# Patient Record
Sex: Female | Born: 1935 | Race: White | Hispanic: No | Marital: Single | Smoking: Former smoker
Health system: Southern US, Community
[De-identification: ages and names within clinical notes are randomized; demographics above are authoritative.]

## PROBLEM LIST (undated history)

## (undated) DIAGNOSIS — Z8551 Personal history of malignant neoplasm of bladder: Secondary | ICD-10-CM

## (undated) DIAGNOSIS — F0280 Dementia in other diseases classified elsewhere without behavioral disturbance: Secondary | ICD-10-CM

## (undated) DIAGNOSIS — I10 Essential (primary) hypertension: Secondary | ICD-10-CM

## (undated) DIAGNOSIS — E079 Disorder of thyroid, unspecified: Secondary | ICD-10-CM

## (undated) DIAGNOSIS — G309 Alzheimer's disease, unspecified: Secondary | ICD-10-CM

## (undated) DIAGNOSIS — E7800 Pure hypercholesterolemia, unspecified: Secondary | ICD-10-CM

## (undated) HISTORY — PX: ABDOMINAL HYSTERECTOMY: SHX81

---

## 2017-08-01 ENCOUNTER — Other Ambulatory Visit: Payer: Self-pay

## 2017-08-01 ENCOUNTER — Emergency Department (HOSPITAL_COMMUNITY): Payer: Medicare HMO

## 2017-08-01 ENCOUNTER — Encounter (HOSPITAL_COMMUNITY): Payer: Self-pay

## 2017-08-01 ENCOUNTER — Emergency Department (HOSPITAL_COMMUNITY)
Admission: EM | Admit: 2017-08-01 | Discharge: 2017-08-02 | Disposition: A | Discharge status: Home / Self Care | Payer: Medicare HMO | Attending: Emergency Medicine | Admitting: Emergency Medicine

## 2017-08-01 DIAGNOSIS — Y999 Unspecified external cause status: Secondary | ICD-10-CM | POA: Insufficient documentation

## 2017-08-01 DIAGNOSIS — I10 Essential (primary) hypertension: Secondary | ICD-10-CM | POA: Diagnosis not present

## 2017-08-01 DIAGNOSIS — Y92129 Unspecified place in nursing home as the place of occurrence of the external cause: Secondary | ICD-10-CM | POA: Insufficient documentation

## 2017-08-01 DIAGNOSIS — S01111A Laceration without foreign body of right eyelid and periocular area, initial encounter: Secondary | ICD-10-CM | POA: Diagnosis not present

## 2017-08-01 DIAGNOSIS — S0990XA Unspecified injury of head, initial encounter: Secondary | ICD-10-CM | POA: Insufficient documentation

## 2017-08-01 DIAGNOSIS — Y939 Activity, unspecified: Secondary | ICD-10-CM | POA: Insufficient documentation

## 2017-08-01 DIAGNOSIS — Z79899 Other long term (current) drug therapy: Secondary | ICD-10-CM | POA: Diagnosis not present

## 2017-08-01 DIAGNOSIS — Z23 Encounter for immunization: Secondary | ICD-10-CM | POA: Insufficient documentation

## 2017-08-01 DIAGNOSIS — W1830XA Fall on same level, unspecified, initial encounter: Secondary | ICD-10-CM | POA: Diagnosis not present

## 2017-08-01 DIAGNOSIS — S0181XA Laceration without foreign body of other part of head, initial encounter: Secondary | ICD-10-CM

## 2017-08-01 HISTORY — DX: Essential (primary) hypertension: I10

## 2017-08-01 HISTORY — DX: Pure hypercholesterolemia, unspecified: E78.00

## 2017-08-01 HISTORY — DX: Disorder of thyroid, unspecified: E07.9

## 2017-08-01 MED ORDER — TETANUS-DIPHTH-ACELL PERTUSSIS 5-2.5-18.5 LF-MCG/0.5 IM SUSP
0.5000 mL | Freq: Once | INTRAMUSCULAR | Status: AC
  Administered 2017-08-01: 0.5 mL via INTRAMUSCULAR
  Filled 2017-08-01: qty 0.5

## 2017-08-01 MED ORDER — LIDOCAINE-EPINEPHRINE-TETRACAINE (LET) SOLUTION
3.0000 mL | Freq: Once | NASAL | Status: AC
  Administered 2017-08-01: 3 mL via TOPICAL
  Filled 2017-08-01: qty 3

## 2017-08-01 MED ORDER — LIDOCAINE HCL (PF) 1 % IJ SOLN
5.0000 mL | Freq: Once | INTRAMUSCULAR | Status: AC
  Administered 2017-08-01: 5 mL via INTRADERMAL
  Filled 2017-08-01: qty 30

## 2017-08-01 NOTE — ED Provider Notes (Signed)
[facility] DEPT Provider Note   CSN: [id number] Arrival date & time: 08/01/17  2249     History   Chief Complaint Chief Complaint  Patient presents with  . Fall  . Head Injury  . Facial Laceration    HPI   Blood pressure 125/79, pulse 65, temperature 97.6 F (36.4 C), temperature source Oral, resp. rate 16, height 5\' 4"  (1.626 m), weight 63 kg (139 lb), SpO2 97 %.  Lynn Cameron is a 84 y.o. female brought in by EMS for unwitnessed fall at [facility].  She fell on a carpet.  No blood thinners.  Patient denies any neck pain, chest pain, abdominal pain.  She states that she had a mechanical fall and did not lose consciousness.  Has been ambulatory since the event.   Past Medical History:  Diagnosis Date  . High cholesterol   . Hypertension   . Thyroid disease     There are no active problems to display for this patient.   History reviewed. No pertinent surgical history.  OB History    No data available       Home Medications    Prior to Admission medications   Medication Sig Start Date End Date Taking? Authorizing Provider  ALPRAZolam Duanne Moron) 0.25 MG tablet Take 0.25 mg by mouth daily as needed for anxiety.   Yes [provider]  cycloSPORINE (RESTASIS) 0.05 % ophthalmic emulsion Place 1 drop into both eyes 2 (two) times daily.   Yes [provider]  donepezil (ARICEPT) 5 MG tablet Take 5 mg by mouth at bedtime.   Yes [provider]  levothyroxine (SYNTHROID, LEVOTHROID) 50 MCG tablet Take 50 mcg by mouth daily before breakfast.   Yes [provider]  Mineral Oil Heavy OIL Place 2 drops into both ears once a week.   Yes [provider]  pravastatin (PRAVACHOL) 40 MG tablet Take 40 mg by mouth daily.   Yes [provider]  sertraline (ZOLOFT) 100 MG tablet Take 100 mg by mouth daily.   Yes [provider]  traZODone (DESYREL) 50 MG tablet Take 50 mg by mouth at  bedtime.   Yes [provider]  triamterene-hydrochlorothiazide (MAXZIDE-25) 37.5-25 MG tablet Take 1 tablet by mouth every other day.   Yes [provider]    Family History History reviewed. No pertinent family history.  Social History Social History   Tobacco Use  . Smoking status: Never Smoker  . Smokeless tobacco: Never Used  Substance Use Topics  . Alcohol use: No    Frequency: Never  . Drug use: No     Allergies   Patient has no known allergies.   Review of Systems Review of Systems  A complete review of systems was obtained and all systems are negative except as noted in the HPI and PMH.   Physical Exam Updated Vital Signs BP 125/79 (BP Location: Right Arm)   Pulse 65   Temp 97.6 F (36.4 C) (Oral)   Resp 16   Ht 5\' 4"  (1.626 m)   Wt 63 kg (139 lb)   SpO2 97%   BMI 23.86 kg/m   Physical Exam  Constitutional: She appears well-developed and well-nourished. No distress.  HENT:  Head: Normocephalic.  Mouth/Throat: Oropharynx is clear and moist.  3 cm laceration over right eyebrow.  No tenderness to palpation along the orbital rim, extraocular movement is intact without pain or diplopia, no hemotympanum our abnormal odor or rhinorrhea.  Eyes: Conjunctivae  and EOM are normal. Pupils are equal, round, and reactive to light.  Neck: Normal range of motion.  No midline C-spine  tenderness to palpation or step-offs appreciated. Patient has full range of motion without pain.  Grip strength, biceps, triceps 5/5 bilaterally;  can differentiate between pinprick and light touch bilaterally.   Cardiovascular: Normal rate, regular rhythm and intact distal pulses.  Pulmonary/Chest: Effort normal and breath sounds normal. No stridor. No respiratory distress. She has no wheezes. She has no rales. She exhibits no tenderness.  Abdominal: Soft. There is no tenderness.  Musculoskeletal: Normal range of motion.  Neurological: She is alert.  Confabulating    Skin: She is not diaphoretic.  Psychiatric: She has a normal mood and affect.  Nursing note and vitals reviewed.    ED Treatments / Results  Labs (all labs ordered are listed, but only abnormal results are displayed) Labs Reviewed - No data to display  EKG  EKG Interpretation None       Radiology Ct Head Wo Contrast  Result Date: 08/02/2017 CLINICAL DATA:  Status post unwitnessed fall. Laceration above the right orbit. Concern for head or cervical spine injury. EXAM: CT HEAD WITHOUT CONTRAST CT MAXILLOFACIAL WITHOUT CONTRAST CT CERVICAL SPINE WITHOUT CONTRAST TECHNIQUE: Multidetector CT imaging of the head, cervical spine, and maxillofacial structures were performed using the standard protocol without intravenous contrast. Multiplanar CT image reconstructions of the cervical spine and maxillofacial structures were also generated. COMPARISON:  None. FINDINGS: CT HEAD FINDINGS Brain: No evidence of acute infarction, hemorrhage, hydrocephalus, extra-axial collection or mass lesion / mass effect. Prominence of the ventricles and sulci reflects moderate cortical volume loss. Mild periventricular white matter change likely reflects small vessel ischemic microangiopathy. The brainstem and fourth ventricle are within normal limits. The basal ganglia are unremarkable in appearance. The cerebral hemispheres demonstrate grossly normal gray-white differentiation. No mass effect or midline shift is seen. Vascular: No hyperdense vessel or unexpected calcification. Skull: There is no evidence of fracture; visualized osseous structures are unremarkable in appearance. Other: No significant soft tissue abnormalities are seen. CT MAXILLOFACIAL FINDINGS Osseous: There is no evidence of fracture or dislocation. The maxilla and mandible appear intact. The nasal bone is unremarkable in appearance. The visualized dentition demonstrates no acute abnormality. Orbits: The orbits are intact bilaterally. Sinuses: The  visualized paranasal sinuses and mastoid air cells are well-aerated. Soft tissues: No significant soft tissue abnormalities are seen. The parapharyngeal fat planes are preserved. The nasopharynx, oropharynx and hypopharynx are unremarkable in appearance. The visualized portions of the valleculae and piriform sinuses are grossly unremarkable. Mild calcification is noted at the palatine tonsils bilaterally. The parotid and submandibular glands are within normal limits. No cervical lymphadenopathy is seen. CT CERVICAL SPINE FINDINGS Alignment: Normal. Skull base and vertebrae: No acute fracture. No primary bone lesion or focal pathologic process. Soft tissues and spinal canal: No prevertebral fluid or swelling. No visible canal hematoma. Disc levels: There is minimal disc space narrowing at C5-C6, with small anterior and posterior disc osteophyte complexes. Upper chest: Emphysema is noted at the lung apices. A 0.8 cm hypodensity at the right thyroid lobe is likely benign, given its size. Other: No additional soft tissue abnormalities are seen. IMPRESSION: 1. No evidence of traumatic intracranial injury or fracture. 2. No evidence of fracture or subluxation along the cervical spine. 3. No evidence of fracture or dislocation with regard to the maxillofacial structures. 4. Moderate cortical volume loss and scattered small vessel ischemic microangiopathy. 5. Emphysema at the lung apices. Electronically  Signed   By: Garald Balding M.D.   On: 08/02/2017 00:15   Ct Cervical Spine Wo Contrast  Result Date: 08/02/2017 CLINICAL DATA:  Status post unwitnessed fall. Laceration above the right orbit. Concern for head or cervical spine injury. EXAM: CT HEAD WITHOUT CONTRAST CT MAXILLOFACIAL WITHOUT CONTRAST CT CERVICAL SPINE WITHOUT CONTRAST TECHNIQUE: Multidetector CT imaging of the head, cervical spine, and maxillofacial structures were performed using the standard protocol without intravenous contrast. Multiplanar CT image  reconstructions of the cervical spine and maxillofacial structures were also generated. COMPARISON:  None. FINDINGS: CT HEAD FINDINGS Brain: No evidence of acute infarction, hemorrhage, hydrocephalus, extra-axial collection or mass lesion / mass effect. Prominence of the ventricles and sulci reflects moderate cortical volume loss. Mild periventricular white matter change likely reflects small vessel ischemic microangiopathy. The brainstem and fourth ventricle are within normal limits. The basal ganglia are unremarkable in appearance. The cerebral hemispheres demonstrate grossly normal gray-white differentiation. No mass effect or midline shift is seen. Vascular: No hyperdense vessel or unexpected calcification. Skull: There is no evidence of fracture; visualized osseous structures are unremarkable in appearance. Other: No significant soft tissue abnormalities are seen. CT MAXILLOFACIAL FINDINGS Osseous: There is no evidence of fracture or dislocation. The maxilla and mandible appear intact. The nasal bone is unremarkable in appearance. The visualized dentition demonstrates no acute abnormality. Orbits: The orbits are intact bilaterally. Sinuses: The visualized paranasal sinuses and mastoid air cells are well-aerated. Soft tissues: No significant soft tissue abnormalities are seen. The parapharyngeal fat planes are preserved. The nasopharynx, oropharynx and hypopharynx are unremarkable in appearance. The visualized portions of the valleculae and piriform sinuses are grossly unremarkable. Mild calcification is noted at the palatine tonsils bilaterally. The parotid and submandibular glands are within normal limits. No cervical lymphadenopathy is seen. CT CERVICAL SPINE FINDINGS Alignment: Normal. Skull base and vertebrae: No acute fracture. No primary bone lesion or focal pathologic process. Soft tissues and spinal canal: No prevertebral fluid or swelling. No visible canal hematoma. Disc levels: There is minimal disc  space narrowing at C5-C6, with small anterior and posterior disc osteophyte complexes. Upper chest: Emphysema is noted at the lung apices. A 0.8 cm hypodensity at the right thyroid lobe is likely benign, given its size. Other: No additional soft tissue abnormalities are seen. IMPRESSION: 1. No evidence of traumatic intracranial injury or fracture. 2. No evidence of fracture or subluxation along the cervical spine. 3. No evidence of fracture or dislocation with regard to the maxillofacial structures. 4. Moderate cortical volume loss and scattered small vessel ischemic microangiopathy. 5. Emphysema at the lung apices. Electronically Signed   By: Garald Balding M.D.   On: 08/02/2017 00:15   Ct Maxillofacial Wo Contrast  Result Date: 08/02/2017 CLINICAL DATA:  Status post unwitnessed fall. Laceration above the right orbit. Concern for head or cervical spine injury. EXAM: CT HEAD WITHOUT CONTRAST CT MAXILLOFACIAL WITHOUT CONTRAST CT CERVICAL SPINE WITHOUT CONTRAST TECHNIQUE: Multidetector CT imaging of the head, cervical spine, and maxillofacial structures were performed using the standard protocol without intravenous contrast. Multiplanar CT image reconstructions of the cervical spine and maxillofacial structures were also generated. COMPARISON:  None. FINDINGS: CT HEAD FINDINGS Brain: No evidence of acute infarction, hemorrhage, hydrocephalus, extra-axial collection or mass lesion / mass effect. Prominence of the ventricles and sulci reflects moderate cortical volume loss. Mild periventricular white matter change likely reflects small vessel ischemic microangiopathy. The brainstem and fourth ventricle are within normal limits. The basal ganglia are unremarkable in appearance. The cerebral hemispheres  demonstrate grossly normal gray-white differentiation. No mass effect or midline shift is seen. Vascular: No hyperdense vessel or unexpected calcification. Skull: There is no evidence of fracture; visualized osseous  structures are unremarkable in appearance. Other: No significant soft tissue abnormalities are seen. CT MAXILLOFACIAL FINDINGS Osseous: There is no evidence of fracture or dislocation. The maxilla and mandible appear intact. The nasal bone is unremarkable in appearance. The visualized dentition demonstrates no acute abnormality. Orbits: The orbits are intact bilaterally. Sinuses: The visualized paranasal sinuses and mastoid air cells are well-aerated. Soft tissues: No significant soft tissue abnormalities are seen. The parapharyngeal fat planes are preserved. The nasopharynx, oropharynx and hypopharynx are unremarkable in appearance. The visualized portions of the valleculae and piriform sinuses are grossly unremarkable. Mild calcification is noted at the palatine tonsils bilaterally. The parotid and submandibular glands are within normal limits. No cervical lymphadenopathy is seen. CT CERVICAL SPINE FINDINGS Alignment: Normal. Skull base and vertebrae: No acute fracture. No primary bone lesion or focal pathologic process. Soft tissues and spinal canal: No prevertebral fluid or swelling. No visible canal hematoma. Disc levels: There is minimal disc space narrowing at C5-C6, with small anterior and posterior disc osteophyte complexes. Upper chest: Emphysema is noted at the lung apices. A 0.8 cm hypodensity at the right thyroid lobe is likely benign, given its size. Other: No additional soft tissue abnormalities are seen. IMPRESSION: 1. No evidence of traumatic intracranial injury or fracture. 2. No evidence of fracture or subluxation along the cervical spine. 3. No evidence of fracture or dislocation with regard to the maxillofacial structures. 4. Moderate cortical volume loss and scattered small vessel ischemic microangiopathy. 5. Emphysema at the lung apices. Electronically Signed   By: Garald Balding M.D.   On: 08/02/2017 00:15    Procedures .Marland KitchenLaceration Repair Date/Time: 08/02/2017 12:41 AM Performed by:  Monico Blitz, PA-C Authorized by: Monico Blitz, PA-C   Consent:    Consent obtained:  Verbal   Consent given by:  Patient   Alternatives discussed:  No treatment and delayed treatment Anesthesia (see MAR for exact dosages):    Anesthesia method:  None Repair type:    Repair type:  Intermediate Pre-procedure details:    Preparation:  Patient was prepped and draped in usual sterile fashion Exploration:    Wound exploration: wound explored through full range of motion     Contaminated: no   Skin repair:    Repair method:  Sutures   Suture size:  6-0   Wound skin closure material used: ethylon.   Suture technique: 3 simple interrupted, one corner suture.   Number of sutures:  4 Approximation:    Approximation:  Close   Vermilion border: well-aligned   Post-procedure details:    Dressing:  Antibiotic ointment   Patient tolerance of procedure:  Tolerated well, no immediate complications   (including critical care time)  Medications Ordered in ED Medications  bacitracin ointment 1 application (not administered)  lidocaine-EPINEPHrine-tetracaine (LET) solution (3 mLs Topical Given 08/01/17 2357)  lidocaine (PF) (XYLOCAINE) 1 % injection 5 mL (5 mLs Intradermal Given 08/01/17 2358)  Tdap (BOOSTRIX) injection 0.5 mL (0.5 mLs Intramuscular Given 08/01/17 2357)     Initial Impression / Assessment and Plan / ED Course  I have reviewed the triage vital signs and the nursing notes.  Pertinent labs & imaging results that were available during my care of the patient were reviewed by me and considered in my medical decision making (see chart for details).     Vitals:  08/01/17 2253 08/01/17 2256  BP: 125/79   Pulse: 65   Resp: 16   Temp: 97.6 F (36.4 C)   TempSrc: Oral   SpO2: 97%   Weight:  63 kg (139 lb)  Height:  5\' 4"  (1.626 m)    Medications  bacitracin ointment 1 application (not administered)  lidocaine-EPINEPHrine-tetracaine (LET) solution (3 mLs Topical  Given 08/01/17 2357)  lidocaine (PF) (XYLOCAINE) 1 % injection 5 mL (5 mLs Intradermal Given 08/01/17 2358)  Tdap (BOOSTRIX) injection 0.5 mL (0.5 mLs Intramuscular Given 08/01/17 2357)    Lynn Cameron is 84 y.o. female presenting with mechanical fall and head trauma.  No other signs of trauma.  Patient states that she has no pain anywhere.  CT max face, head and C-spine negative.  Wound is cleaned and closed and tetanus is updated.  Counseled daughter on wound care and return precautions.  Evaluation does not show pathology that would require ongoing emergent intervention or inpatient treatment. Pt is hemodynamically stable and mentating appropriately. Discussed findings and plan with patient/guardian, who agrees with care plan. All questions answered. Return precautions discussed and outpatient follow up given.      Final Clinical Impressions(s) / ED Diagnoses   Final diagnoses:  Facial laceration, initial encounter  Injury of head, initial encounter    ED Discharge Orders    None       Karen Kays Charna Elizabeth 08/02/17 0043    Shanon Rosser, MD 08/02/17 [phone]

## 2017-08-01 NOTE — ED Notes (Signed)
Bed: RESB Expected date:  Expected time:  Means of arrival:  Comments: EMS 84 yo female from assisted living-fall head injury

## 2017-08-01 NOTE — ED Notes (Signed)
Pt refused to wear a gown. °

## 2017-08-01 NOTE — ED Triage Notes (Signed)
Fell at nursing home no LOC voiced and no laceration on right eye area with slight bleeding noted alert and talking at this time no other complaints voiced. Sitting on side of bed refuses to lie down.

## 2017-08-01 NOTE — ED Triage Notes (Signed)
Patient arrives by [facility] from [facility] with complaints of unwitnessed fall-patient stated she fell on a carpet-laceration above right eye. EMS states no other injuries-No blood thinners-patient has DNR.

## 2017-08-02 MED ORDER — BACITRACIN ZINC 500 UNIT/GM EX OINT
1.0000 "application " | TOPICAL_OINTMENT | Freq: Once | CUTANEOUS | Status: AC
  Administered 2017-08-02: 1 via TOPICAL
  Filled 2017-08-02: qty 0.9

## 2017-08-02 NOTE — Discharge Instructions (Signed)

## 2017-08-26 ENCOUNTER — Emergency Department (HOSPITAL_COMMUNITY): Payer: Medicare HMO

## 2017-08-26 ENCOUNTER — Other Ambulatory Visit: Payer: Self-pay

## 2017-08-26 ENCOUNTER — Encounter (HOSPITAL_COMMUNITY): Payer: Self-pay | Admitting: Emergency Medicine

## 2017-08-26 ENCOUNTER — Emergency Department (HOSPITAL_COMMUNITY)
Admission: EM | Admit: 2017-08-26 | Discharge: 2017-08-26 | Disposition: A | Discharge status: Home / Self Care | Payer: Medicare HMO | Attending: Emergency Medicine | Admitting: Emergency Medicine

## 2017-08-26 DIAGNOSIS — R296 Repeated falls: Secondary | ICD-10-CM | POA: Diagnosis not present

## 2017-08-26 DIAGNOSIS — S0990XA Unspecified injury of head, initial encounter: Secondary | ICD-10-CM | POA: Diagnosis not present

## 2017-08-26 DIAGNOSIS — E039 Hypothyroidism, unspecified: Secondary | ICD-10-CM | POA: Insufficient documentation

## 2017-08-26 DIAGNOSIS — Y92129 Unspecified place in nursing home as the place of occurrence of the external cause: Secondary | ICD-10-CM | POA: Insufficient documentation

## 2017-08-26 DIAGNOSIS — I10 Essential (primary) hypertension: Secondary | ICD-10-CM | POA: Diagnosis not present

## 2017-08-26 DIAGNOSIS — W19XXXA Unspecified fall, initial encounter: Secondary | ICD-10-CM | POA: Insufficient documentation

## 2017-08-26 DIAGNOSIS — M25552 Pain in left hip: Secondary | ICD-10-CM

## 2017-08-26 DIAGNOSIS — Y999 Unspecified external cause status: Secondary | ICD-10-CM | POA: Diagnosis not present

## 2017-08-26 DIAGNOSIS — Y939 Activity, unspecified: Secondary | ICD-10-CM | POA: Insufficient documentation

## 2017-08-26 DIAGNOSIS — Z79899 Other long term (current) drug therapy: Secondary | ICD-10-CM | POA: Diagnosis not present

## 2017-08-26 LAB — BASIC METABOLIC PANEL
ANION GAP: 9 (ref 5–15)
BUN: 31 mg/dL — ABNORMAL HIGH (ref 6–20)
CALCIUM: 9.2 mg/dL (ref 8.9–10.3)
CO2: 30 mmol/L (ref 22–32)
Chloride: 99 mmol/L — ABNORMAL LOW (ref 101–111)
Creatinine, Ser: 0.99 mg/dL (ref 0.44–1.00)
GFR calc Af Amer: 57 mL/min — ABNORMAL LOW (ref >60)
GFR, EST NON AFRICAN AMERICAN: 49 mL/min — AB (ref >60)
Glucose, Bld: 124 mg/dL — ABNORMAL HIGH (ref 65–99)
Potassium: 3.1 mmol/L — ABNORMAL LOW (ref 3.5–5.1)
Sodium: 138 mmol/L (ref 135–145)

## 2017-08-26 LAB — CBC WITH DIFFERENTIAL/PLATELET
BASOS ABS: 0 10*3/uL (ref 0.0–0.1)
Basophils Relative: 0 %
EOS PCT: 0 %
Eosinophils Absolute: 0 10*3/uL (ref 0.0–0.7)
HEMATOCRIT: 38.5 % (ref 36.0–46.0)
Hemoglobin: 13.3 g/dL (ref 12.0–15.0)
Lymphocytes Relative: 6 %
Lymphs Abs: 0.7 10*3/uL (ref 0.7–4.0)
MCH: 30.1 pg (ref 26.0–34.0)
MCHC: 34.5 g/dL (ref 30.0–36.0)
MCV: 87.1 fL (ref 78.0–100.0)
MONO ABS: 1 10*3/uL (ref 0.1–1.0)
Monocytes Relative: 8 %
NEUTROS ABS: 10.6 10*3/uL — AB (ref 1.7–7.7)
Neutrophils Relative %: 86 %
PLATELETS: 272 10*3/uL (ref 150–400)
RBC: 4.42 MIL/uL (ref 3.87–5.11)
RDW: 13.1 % (ref 11.5–15.5)
WBC: 12.3 10*3/uL — ABNORMAL HIGH (ref 4.0–10.5)

## 2017-08-26 MED ORDER — ACETAMINOPHEN 500 MG PO TABS
1000.0000 mg | ORAL_TABLET | Freq: Once | ORAL | Status: AC
  Administered 2017-08-26: 1000 mg via ORAL
  Filled 2017-08-26: qty 2

## 2017-08-26 NOTE — Discharge Instructions (Signed)
Take tylenol 1000mg(2 extra strength) four times a day.  ° °

## 2017-08-26 NOTE — ED Triage Notes (Signed)
Pt arriving from SNF. Staff reports that they left pt in her room for approx 15 minutes and when they came back they found her lying on her left side. Pt initially complaining of left hip pain prior to arrival.

## 2017-08-26 NOTE — ED Provider Notes (Signed)
[facility] DEPT Provider Note   CSN: [id number] Arrival date & time: 08/26/17  2048     History   Chief Complaint Chief Complaint  Patient presents with  . Hip Pain    left    HPI Lynn Cameron is a 84 y.o. female.  84 yo F with a cc of leg pain.  The patient had 2 mechanical falls over the past couple days.  She is unsure why she is falling.  She is demented.  Recently moved from [place].  She denies any pains and would like to go home.  Family thinks that her left hip has been bothering her.  I also think may be that she struck her head.  The history is provided by the patient.  Hip Pain  This is a new problem. The current episode started less than 1 hour ago. The problem occurs constantly. The problem has not changed since onset.Pertinent negatives include no chest pain, no headaches and no shortness of breath. Nothing aggravates the symptoms. Nothing relieves the symptoms. She has tried nothing for the symptoms. The treatment provided no relief.  Illness  This is a new problem. The current episode started less than 1 hour ago. The problem occurs constantly. The problem has not changed since onset.Pertinent negatives include no chest pain, no headaches and no shortness of breath. Nothing aggravates the symptoms. Nothing relieves the symptoms. She has tried nothing for the symptoms. The treatment provided no relief.    Past Medical History:  Diagnosis Date  . High cholesterol   . Hypertension   . Thyroid disease     There are no active problems to display for this patient.   History reviewed. No pertinent surgical history.   OB History   None      Home Medications    Prior to Admission medications   Medication Sig Start Date End Date Taking? Authorizing Provider  acetaminophen (TYLENOL) 500 MG tablet Take 500 mg by mouth 3 (three) times daily.   Yes [provider]  ALPRAZolam (XANAX) 0.25 MG tablet Take 0.25 mg by mouth  daily as needed for anxiety.   Yes [provider]  cycloSPORINE (RESTASIS) 0.05 % ophthalmic emulsion Place 1 drop into both eyes 2 (two) times daily.   Yes [provider]  levothyroxine (SYNTHROID, LEVOTHROID) 50 MCG tablet Take 50 mcg by mouth daily before breakfast.   Yes [provider]  Mineral Oil Heavy OIL Place 2 drops into both ears once a week.   Yes [provider]  pravastatin (PRAVACHOL) 40 MG tablet Take 40 mg by mouth daily.   Yes [provider]  sertraline (ZOLOFT) 100 MG tablet Take 100 mg by mouth daily.   Yes [provider]  traZODone (DESYREL) 50 MG tablet Take 50 mg by mouth at bedtime.   Yes [provider]  triamterene-hydrochlorothiazide (MAXZIDE-25) 37.5-25 MG tablet Take 1 tablet by mouth every other day.   Yes [provider]    Family History No family history on file.  Social History Social History   Tobacco Use  . Smoking status: Never Smoker  . Smokeless tobacco: Never Used  Substance Use Topics  . Alcohol use: No    Frequency: Never  . Drug use: No     Allergies   Patient has no known allergies.   Review of Systems Review of Systems  Constitutional: Negative for chills and fever.  HENT: Negative for congestion and rhinorrhea.   Eyes:  Negative for redness and visual disturbance.  Respiratory: Negative for shortness of breath and wheezing.   Cardiovascular: Negative for chest pain and palpitations.  Gastrointestinal: Negative for nausea and vomiting.  Genitourinary: Negative for dysuria and urgency.  Musculoskeletal: Positive for arthralgias and myalgias.  Skin: Negative for pallor and wound.  Neurological: Negative for dizziness and headaches.     Physical Exam Updated Vital Signs BP 113/86 (BP Location: Left Arm)   Pulse 92   Temp 98.3 F (36.8 C) (Oral)   Resp (!) 21   SpO2 93%   Physical Exam  Constitutional: She is oriented to person, place, and time.  She appears well-developed and well-nourished. No distress.  HENT:  Head: Normocephalic and atraumatic.  Eyes: Pupils are equal, round, and reactive to light. EOM are normal.  Neck: Normal range of motion. Neck supple.  Cardiovascular: Normal rate and regular rhythm. Exam reveals no gallop and no friction rub.  No murmur heard. Pulmonary/Chest: Effort normal. She has no wheezes. She has no rales.  Abdominal: Soft. She exhibits no distension. There is no tenderness.  Musculoskeletal: She exhibits no edema or tenderness.  No appreciable tenderness through the low back or the pelvis.  Palpated from head to toe without any noted bony tenderness or signs of trauma.  Intact pulse motor and sensation to bilateral lower extremities  Neurological: She is alert and oriented to person, place, and time.  Skin: Skin is warm and dry. She is not diaphoretic.  Psychiatric: She has a normal mood and affect. Her behavior is normal.  Nursing note and vitals reviewed.    ED Treatments / Results  Labs (all labs ordered are listed, but only abnormal results are displayed) Labs Reviewed  CBC WITH DIFFERENTIAL/PLATELET - Abnormal; Notable for the following components:      Result Value   WBC 12.3 (*)    Neutro Abs 10.6 (*)    All other components within normal limits  BASIC METABOLIC PANEL - Abnormal; Notable for the following components:   Potassium 3.1 (*)    Chloride 99 (*)    Glucose, Bld 124 (*)    BUN 31 (*)    GFR calc non Af Amer 49 (*)    GFR calc Af Amer 57 (*)    All other components within normal limits    EKG EKG Interpretation  Date/Time:  Thursday August 26 2017 21:25:49 EDT Ventricular Rate:  84 PR Interval:    QRS Duration: 98 QT Interval:  402 QTC Calculation: 476 R Axis:   -67 Text Interpretation:  Sinus rhythm Left anterior fascicular block Nonspecific T abnormalities, lateral leads No old tracing to compare Confirmed by Deno Etienne [phone]) on 08/26/2017 10:12:09  PM   Radiology Dg Chest 1 View  Result Date: 08/26/2017 CLINICAL DATA:  Fall with hip pain EXAM: CHEST  1 VIEW COMPARISON:  None. FINDINGS: No acute pulmonary infiltrate or effusion. Normal cardiomediastinal silhouette with aortic atherosclerosis. No pneumothorax. IMPRESSION: No active disease. Electronically Signed   By: Donavan Foil M.D.   On: 08/26/2017 21:57   Dg Lumbar Spine Complete  Result Date: 08/26/2017 CLINICAL DATA:  Low back pain after fall. EXAM: LUMBAR SPINE - COMPLETE 4+ VIEW COMPARISON:  None. FINDINGS: Five lumbar type vertebral bodies. Age-indeterminate moderate compression fracture of L1. Remaining intervertebral body heights are preserved. Trace anterolisthesis at L2-L3 and L4-L5 due to facet arthropathy. Moderate to severe disc height loss at L5-S1. Remaining intervertebral disc spaces are relatively preserved. Aortic atherosclerosis. IMPRESSION: 1. Age-indeterminate moderate  compression fracture of L1. Correlate with point tenderness. 2. Moderate to severe degenerative disc disease at L5-S1. Electronically Signed   By: Titus Dubin M.D.   On: 08/26/2017 21:57   Ct Head Wo Contrast  Result Date: 08/26/2017 CLINICAL DATA:  Unwitnessed fall.  Struck head. EXAM: CT HEAD WITHOUT CONTRAST TECHNIQUE: Contiguous axial images were obtained from the base of the skull through the vertex without intravenous contrast. COMPARISON:  08/01/2017 FINDINGS: Brain: Diffuse cerebral atrophy. Ventricular dilatation is likely due to central atrophy. Patchy low-attenuation changes in the deep white matter consistent with small vessel ischemia. No mass effect or midline shift. No abnormal extra-axial fluid collections. Gray-white matter junctions are mostly distinct. Basal cisterns are not effaced. No acute intracranial hemorrhage. Vascular: Intracranial arterial vascular calcifications are present. Skull: Normal. Negative for fracture or focal lesion. Sinuses/Orbits: No acute finding. Other: None.  IMPRESSION: 1. No acute intracranial abnormalities. 2. Chronic atrophy and small vessel ischemic changes. Electronically Signed   By: Lucienne Capers M.D.   On: 08/26/2017 22:06   Dg Hip Unilat W Or Wo Pelvis 2-3 Views Left  Result Date: 08/26/2017 CLINICAL DATA:  Low back and left hip pain after fall. EXAM: DG HIP (WITH OR WITHOUT PELVIS) 2-3V LEFT COMPARISON:  None. FINDINGS: No acute fracture or dislocation. The hip joint spaces are relatively preserved. The pubic symphysis and sacroiliac joints are intact. Osteopenia. Degenerative changes of the lower lumbar spine. Soft tissues are unremarkable. IMPRESSION: No acute osseous abnormality. Electronically Signed   By: Titus Dubin M.D.   On: 08/26/2017 21:54    Procedures Procedures (including critical care time)  Medications Ordered in ED Medications  acetaminophen (TYLENOL) tablet 1,000 mg (1,000 mg Oral Given 08/26/17 2219)     Initial Impression / Assessment and Plan / ED Course  I have reviewed the triage vital signs and the nursing notes.  Pertinent labs & imaging results that were available during my care of the patient were reviewed by me and considered in my medical decision making (see chart for details).     84 yo F with a chief complaint of 2 falls.  There is some thought that she had been complaining of left side pain.  Plain film of the pelvis and left hip is negative for fracture.  The patient has a questionable subacute fracture at L1.  She has no midline spinal tenderness on my exam I suspect that this is a old finding.  We will have her follow-up with her PCP.  11:38 PM:  I have discussed the diagnosis/risks/treatment options with the patient and family and believe the pt to be eligible for discharge home to follow-up with PCP. We also discussed returning to the ED immediately if new or worsening sx occur. We discussed the sx which are most concerning (e.g., sudden worsening pain, fever, inability to tolerate by mouth)  that necessitate immediate return. Medications administered to the patient during their visit and any new prescriptions provided to the patient are listed below.  Medications given during this visit Medications  acetaminophen (TYLENOL) tablet 1,000 mg (1,000 mg Oral Given 08/26/17 2219)     The patient appears reasonably screen and/or stabilized for discharge and I doubt any other medical condition or other [facility] requiring further screening, evaluation, or treatment in the ED at this time prior to discharge.    Final Clinical Impressions(s) / ED Diagnoses   Final diagnoses:  Fall, initial encounter  Left hip pain    ED Discharge Orders  None       Deno Etienne, [place] 08/26/17 2338

## 2017-08-26 NOTE — ED Notes (Signed)
Bed: PZ96 Expected date:  Expected time:  Means of arrival:  Comments: EMS 84 yo female from memory care-unwitnessed fall-hit head-left hip pain/combative

## 2017-11-30 ENCOUNTER — Non-Acute Institutional Stay: Payer: Medicare HMO | Admitting: Internal Medicine

## 2017-11-30 VITALS — BP 110/60 | HR 71 | Ht 64.0 in | Wt 95.0 lb

## 2017-11-30 DIAGNOSIS — E43 Unspecified severe protein-calorie malnutrition: Secondary | ICD-10-CM

## 2017-11-30 DIAGNOSIS — C679 Malignant neoplasm of bladder, unspecified: Secondary | ICD-10-CM

## 2017-11-30 DIAGNOSIS — F0391 Unspecified dementia with behavioral disturbance: Secondary | ICD-10-CM

## 2017-11-30 DIAGNOSIS — Z515 Encounter for palliative care: Secondary | ICD-10-CM

## 2017-11-30 DIAGNOSIS — Z7409 Other reduced mobility: Secondary | ICD-10-CM

## 2017-12-01 ENCOUNTER — Encounter: Payer: Self-pay | Admitting: Internal Medicine

## 2017-12-01 DIAGNOSIS — E46 Unspecified protein-calorie malnutrition: Secondary | ICD-10-CM | POA: Insufficient documentation

## 2017-12-01 NOTE — Progress Notes (Signed)
11/30/2017  PALLIATIVE CARE CONSULT Lynn Cameron "Lynn Cameron". DOB: 1926/12/08. [facility] (Room 402) [place] [phone], fax [phone] REFERRING PROVIDER: Farrel Conners, NP/Dr. Salem Cameron [facility])  FAMILY: (dtr) Lynn Cameron [phone], [phone], (son in law)  Lynn Cameron [phone], (son) Lynn Cameron [facility]) [phone], [phone]  BILLABLE  ICD-10:  IMPRESSION/RECOMMENDATIONS: 1. Protein Calorie Malnutrition: Weight loss 24.5% of body mass (31 lbs) over last 5 months (13 of those lbs lost this last month) to current weight of 95 lbs. Ht 5'4" BMI 16.3 kg/m2. Has a good appetite; frequent snacks and consuming most of her meals.  2. Progressive Dementia (FAST 6e, from FAST 4 seven months earlier): 100% dependent for dressing/hygiene. Urinary and fecal incontinence despite facility toileting schedule. Extremely poor short-term memory with less than a few minutes retention. A & O x 0; constantly confused. Usually remembers her daughter's name; not relationship. Doesn't recognize staff members. Staff report recent, episodic behavioral disturbances (disrobing/argumentative) improved with Xanax bid.  3. Mobility disorder r/t dementia: Increased frequency of falls, with ER visits 03/10 R (eyebrow laceration) and 08/26/17 (R hip pain; no fx).  Numerous falls in facility; at least 7 over last 6 weeks. In January ambulating independently. Currently 100% use of walker.  4. H/O bladder cancer May 2017 (no records available; daughter report). Bladder scrapping/palliative XRT. Follow up staging revealed spread but details not available. Recommended no further treatment in view of patient's age/state of health. No current hematuria/pain/problems with urination.  5. Advanced Care Planning: DNR on chart. Family meeting with daughter who clarified goals of care (DNR/DNI, comfort care only) and mentioned that patient's son in [place]  shared theses same goals. Discussed hospice referral; daughter is in agreement and referral made.  I spent 75 minutes providing this consultation (from 10:30am to 11:45am). More than 50% of that time was spent coordinating communication.  HPI: Lynn Cameron is Lynn Cameron 84 yo female resident ot [facility], with progressive dementia and weight loss. She has a h/o bladder cancer (records unavailable) for which she was treated with palliative radiation 2017. Referred to Palliative Care due to significant health decline, for assistance with symptom management, help with coordination of resources, and ongoing discussions regarding goals of care/advanced directives  CODE STATUS: DNR (current on chart)  PPS: 40%.  HOSPICE ELIGIBILITY: yes (protein calorie malnutrition)  MEDICATIONS :  Acetaminophen 500mg  tid, alprazolam 0.25mg  bid and prn (rare, occasional use), cyclosporine ophthalmic 1 gtt pi bid, levothyroxine 50 mcg qd, mineral oil 2gtts both ears q wk, pravastatin (Pravachol) 40mg  qd, sertraline (Zoloft) 100mg  qd, Trazodone (Desyrel) 50mg  qhs, Maxzide 37.5-25mg  qod,  ALLERGIES: NKDA  PAST MEDICAL HISTORY: elevated cholesterol, HTN, thyroid disease, bladder cancer dx May 2017 (bladder scraping/palliative XRT course; no further therapy recommended due to patient's age),  tobacco abuse. 08/26/2017 ER visit following 2 mechanical falls; left hip pain (no fracture) ? subacute fx L-1. *WBC 12.3 08/01/2017 ER above R eyebrow laceration following fall. SH: Widowed. 2 children (son back in [place], daughter here in [place]). Recently moved from [place] Jan 2019 and is currently resident of [facility].  PHYSICAL EXAM: Elderly female who is a little distrustful. Poor short-term memory. Ambulating (needs cuing/encouragement) with walker. VS:BP 110/60, HR 71, Sat (room air) 93% HEENT: [place], AT LUNGS:  CTA bilaterally CARDIAC: RRR without RMG. Distal pulses intact ABD: soft, non-distended,nnon-tender.  NABS EXTREMITIES: no pedal edema MUSCULOSKELETAL: left pinky contracted  to 90 degrees. SKIN: Exposed skin warm, dry and intact NEURO: Constantly confused. Episodically A & O to self, though speech remains intact.  Lynn Gelinas NP-C

## 2018-02-17 ENCOUNTER — Non-Acute Institutional Stay: Payer: Medicare HMO | Admitting: Nurse Practitioner

## 2018-02-17 DIAGNOSIS — R269 Unspecified abnormalities of gait and mobility: Secondary | ICD-10-CM

## 2018-02-17 DIAGNOSIS — E46 Unspecified protein-calorie malnutrition: Secondary | ICD-10-CM

## 2018-02-17 DIAGNOSIS — Z515 Encounter for palliative care: Secondary | ICD-10-CM

## 2018-02-17 DIAGNOSIS — F0390 Unspecified dementia without behavioral disturbance: Secondary | ICD-10-CM

## 2018-02-17 DIAGNOSIS — R296 Repeated falls: Secondary | ICD-10-CM

## 2018-02-17 DIAGNOSIS — F339 Major depressive disorder, recurrent, unspecified: Secondary | ICD-10-CM

## 2018-02-18 ENCOUNTER — Encounter: Payer: Self-pay | Admitting: Nurse Practitioner

## 2018-02-18 NOTE — Progress Notes (Signed)
    PALLIATIVE CARE CONSULT VISIT   PATIENT NAME: Lynn Cameron DOB: 06-04-1926 MRN: [id number]  -[facility] [id number]  PRIMARY CARE PROVIDER:   Patient, No Pcp Per  REFERRING PROVIDER:  Jacelyn Cameron, [address], [place] [address]  RESPONSIBLE PARTY:   FAMILY: (dtr) Lynn Cameron [phone], [phone], (son in law)  Lynn Cameron [phone], (son) Lynn Cameron) [phone], [phone]   ASSESSMENT/RECOMMENDATIONS and PLAN:  Dementia wo behavioral disturbance  Gait disorder/frequent falls Hearing loss -oriented to person only; requires 100% assistance for ADL's is incontinent of bowel and bladder -FAST 6e -patient in room getting in bed with no assistance without rolling walker -no offending medications noted -continue supportive care   Protein-calorie malnutrition/FTT-Impoved -weight improved 01/28/18 112.6 /height 5'4" -BMI 19.3 -continue to record intake -monthly weights  Chronic medical problems -hypothyroidism -HLD -depression  -Managed by primary care provider: continue regimen as prescribed.    ACP -DNR/DNI-comfort care   I spent 30 minutes providing this consultation,  from 13:00 to 14:00. More than 50% of the time in this consultation was spent coordinating communication.   HISTORY OF PRESENT ILLNESS:  Lynn Cameron is a 84 y.o. year old female with multiple medical problems including dementia, gait disorder, frequent falls, protein-calorie malnutrition,. Palliative Care was asked to help address goals of care.   CODE STATUS: DNR/DNI-comfort care  PPS: 40% HOSPICE ELIGIBILITY/DIAGNOSIS: TBD  PAST MEDICAL HISTORY:  Past Medical History:  Diagnosis Date  . High cholesterol   . Hypertension   . Thyroid disease     SOCIAL HX:  Social History   Tobacco Use  . Smoking status: Never Smoker  . Smokeless tobacco: Never Used  Substance Use Topics  . Alcohol use: No    Frequency:  Never    ALLERGIES: No Known Allergies   PERTINENT MEDICATIONS:  Outpatient Encounter Medications as of 02/17/2018  Medication Sig  . acetaminophen (TYLENOL) 500 MG tablet Take 500 mg by mouth 3 (three) times daily.  Marland Kitchen ALPRAZolam (XANAX) 0.25 MG tablet Take 0.25 mg by mouth daily as needed for anxiety.  . cycloSPORINE (RESTASIS) 0.05 % ophthalmic emulsion Place 1 drop into both eyes 2 (two) times daily.  Marland Kitchen levothyroxine (SYNTHROID, LEVOTHROID) 50 MCG tablet Take 50 mcg by mouth daily before breakfast.  . Mineral Oil Heavy OIL Place 2 drops into both ears once a week.  . pravastatin (PRAVACHOL) 40 MG tablet Take 40 mg by mouth daily.  . sertraline (ZOLOFT) 100 MG tablet Take 100 mg by mouth daily.  . traZODone (DESYREL) 50 MG tablet Take 50 mg by mouth at bedtime.  . triamterene-hydrochlorothiazide (MAXZIDE-25) 37.5-25 MG tablet Take 1 tablet by mouth every other day.   No facility-administered encounter medications on file as of 02/17/2018.     PHYSICAL EXAM:   General: NAD, frail appearing, thin Cardiovascular: regular rate and rhythm Pulmonary: clear ant fields; moderate kyphosis Abdomen: soft, nontender, + bowel sounds GU: no suprapubic tenderness Extremities: no edema, no joint deformities Skin: no rashes Neurological: non-focal; oriented to self only; is attempting to get into bed; thinks it is bedtime; not using rolling walker   Lynn G Martinique, NP

## 2018-03-06 ENCOUNTER — Emergency Department (HOSPITAL_COMMUNITY): Payer: Medicare HMO

## 2018-03-06 ENCOUNTER — Encounter (HOSPITAL_COMMUNITY): Payer: Self-pay

## 2018-03-06 ENCOUNTER — Inpatient Hospital Stay (HOSPITAL_COMMUNITY)
Admission: EM | Admit: 2018-03-06 | Discharge: 2018-03-09 | DRG: 481 | Disposition: A | Discharge status: Skilled Nursing Facility | Payer: Medicare HMO | Source: Skilled Nursing Facility | Attending: Internal Medicine | Admitting: Internal Medicine

## 2018-03-06 ENCOUNTER — Other Ambulatory Visit: Payer: Self-pay

## 2018-03-06 DIAGNOSIS — E039 Hypothyroidism, unspecified: Secondary | ICD-10-CM | POA: Diagnosis present

## 2018-03-06 DIAGNOSIS — E440 Moderate protein-calorie malnutrition: Secondary | ICD-10-CM | POA: Diagnosis present

## 2018-03-06 DIAGNOSIS — G309 Alzheimer's disease, unspecified: Secondary | ICD-10-CM | POA: Diagnosis present

## 2018-03-06 DIAGNOSIS — Z8551 Personal history of malignant neoplasm of bladder: Secondary | ICD-10-CM

## 2018-03-06 DIAGNOSIS — E7800 Pure hypercholesterolemia, unspecified: Secondary | ICD-10-CM | POA: Diagnosis present

## 2018-03-06 DIAGNOSIS — Z923 Personal history of irradiation: Secondary | ICD-10-CM

## 2018-03-06 DIAGNOSIS — F0390 Unspecified dementia without behavioral disturbance: Secondary | ICD-10-CM

## 2018-03-06 DIAGNOSIS — F0280 Dementia in other diseases classified elsewhere without behavioral disturbance: Secondary | ICD-10-CM | POA: Diagnosis present

## 2018-03-06 DIAGNOSIS — Y92129 Unspecified place in nursing home as the place of occurrence of the external cause: Secondary | ICD-10-CM

## 2018-03-06 DIAGNOSIS — S72001D Fracture of unspecified part of neck of right femur, subsequent encounter for closed fracture with routine healing: Secondary | ICD-10-CM | POA: Diagnosis not present

## 2018-03-06 DIAGNOSIS — D62 Acute posthemorrhagic anemia: Secondary | ICD-10-CM | POA: Diagnosis not present

## 2018-03-06 DIAGNOSIS — S72011A Unspecified intracapsular fracture of right femur, initial encounter for closed fracture: Secondary | ICD-10-CM | POA: Diagnosis present

## 2018-03-06 DIAGNOSIS — W19XXXA Unspecified fall, initial encounter: Secondary | ICD-10-CM | POA: Diagnosis present

## 2018-03-06 DIAGNOSIS — S72009A Fracture of unspecified part of neck of unspecified femur, initial encounter for closed fracture: Secondary | ICD-10-CM | POA: Diagnosis present

## 2018-03-06 DIAGNOSIS — I10 Essential (primary) hypertension: Secondary | ICD-10-CM | POA: Diagnosis present

## 2018-03-06 DIAGNOSIS — Z79890 Hormone replacement therapy (postmenopausal): Secondary | ICD-10-CM

## 2018-03-06 DIAGNOSIS — Z66 Do not resuscitate: Secondary | ICD-10-CM | POA: Diagnosis present

## 2018-03-06 DIAGNOSIS — R296 Repeated falls: Secondary | ICD-10-CM | POA: Diagnosis present

## 2018-03-06 DIAGNOSIS — E785 Hyperlipidemia, unspecified: Secondary | ICD-10-CM | POA: Diagnosis present

## 2018-03-06 DIAGNOSIS — F419 Anxiety disorder, unspecified: Secondary | ICD-10-CM | POA: Diagnosis present

## 2018-03-06 DIAGNOSIS — S72001A Fracture of unspecified part of neck of right femur, initial encounter for closed fracture: Secondary | ICD-10-CM

## 2018-03-06 HISTORY — DX: Personal history of malignant neoplasm of bladder: Z85.51

## 2018-03-06 HISTORY — DX: Alzheimer's disease, unspecified: G30.9

## 2018-03-06 HISTORY — DX: Dementia in other diseases classified elsewhere, unspecified severity, without behavioral disturbance, psychotic disturbance, mood disturbance, and anxiety: F02.80

## 2018-03-06 LAB — CBC WITH DIFFERENTIAL/PLATELET
Abs Immature Granulocytes: 0.09 10*3/uL — ABNORMAL HIGH (ref 0.00–0.07)
Basophils Absolute: 0 10*3/uL (ref 0.0–0.1)
Basophils Relative: 0 %
Eosinophils Absolute: 0.2 10*3/uL (ref 0.0–0.5)
Eosinophils Relative: 2 %
HCT: 36.8 % (ref 36.0–46.0)
Hemoglobin: 11.8 g/dL — ABNORMAL LOW (ref 12.0–15.0)
Immature Granulocytes: 1 %
Lymphocytes Relative: 7 %
Lymphs Abs: 0.8 10*3/uL (ref 0.7–4.0)
MCH: 28.9 pg (ref 26.0–34.0)
MCHC: 32.1 g/dL (ref 30.0–36.0)
MCV: 90.2 fL (ref 80.0–100.0)
Monocytes Absolute: 1.2 10*3/uL — ABNORMAL HIGH (ref 0.1–1.0)
Monocytes Relative: 10 %
Neutro Abs: 9.5 10*3/uL — ABNORMAL HIGH (ref 1.7–7.7)
Neutrophils Relative %: 80 %
Platelets: 314 10*3/uL (ref 150–400)
RBC: 4.08 MIL/uL (ref 3.87–5.11)
RDW: 13.4 % (ref 11.5–15.5)
WBC: 11.8 10*3/uL — ABNORMAL HIGH (ref 4.0–10.5)
nRBC: 0 % (ref 0.0–0.2)

## 2018-03-06 LAB — BASIC METABOLIC PANEL
Anion gap: 12 (ref 5–15)
BUN: 23 mg/dL (ref 8–23)
CALCIUM: 9 mg/dL (ref 8.9–10.3)
CO2: 27 mmol/L (ref 22–32)
CREATININE: 0.8 mg/dL (ref 0.44–1.00)
Chloride: 104 mmol/L (ref 98–111)
GFR calc Af Amer: >60 mL/min (ref >60)
GLUCOSE: 115 mg/dL — AB (ref 70–99)
Potassium: 4.1 mmol/L (ref 3.5–5.1)
Sodium: 143 mmol/L (ref 135–145)

## 2018-03-06 LAB — PROTIME-INR
INR: 0.86
Prothrombin Time: 11.6 seconds (ref 11.4–15.2)

## 2018-03-06 LAB — SURGICAL PCR SCREEN
MRSA, PCR: NEGATIVE
Staphylococcus aureus: NEGATIVE

## 2018-03-06 LAB — TYPE AND SCREEN
ABO/RH(D): B POS
Antibody Screen: NEGATIVE

## 2018-03-06 MED ORDER — LEVOTHYROXINE SODIUM 50 MCG PO TABS: 50.0000 ug | ORAL_TABLET | Freq: Every day | ORAL | Status: DC

## 2018-03-06 MED ORDER — MORPHINE SULFATE (PF) 2 MG/ML IV SOLN: 2.0000 mg | INTRAVENOUS | Status: DC | PRN

## 2018-03-06 MED ORDER — SODIUM CHLORIDE 0.9 % IV SOLN
INTRAVENOUS | Status: DC
  Administered 2018-03-06 – 2018-03-08 (×3): via INTRAVENOUS

## 2018-03-06 MED ORDER — SERTRALINE HCL 100 MG PO TABS: 100.0000 mg | ORAL_TABLET | Freq: Every day | ORAL | Status: DC

## 2018-03-06 MED ORDER — LORAZEPAM 0.5 MG PO TABS
0.5000 mg | ORAL_TABLET | Freq: Two times a day (BID) | ORAL | Status: DC
  Administered 2018-03-06 – 2018-03-09 (×3): 0.5 mg via ORAL
  Filled 2018-03-06 (×4): qty 1

## 2018-03-06 MED ORDER — TRAZODONE HCL 50 MG PO TABS
50.0000 mg | ORAL_TABLET | Freq: Every day | ORAL | Status: DC
  Administered 2018-03-06: 50 mg via ORAL
  Filled 2018-03-06 (×2): qty 1

## 2018-03-06 MED ORDER — OXYCODONE-ACETAMINOPHEN 5-325 MG PO TABS
1.0000 | ORAL_TABLET | ORAL | Status: DC | PRN
  Administered 2018-03-08: 1 via ORAL
  Filled 2018-03-06: qty 1

## 2018-03-06 NOTE — H&P (Signed)
History and Physical    Lynn Cameron QXI:[id number] DOB: [birth year] DOA: 03/06/2018  PCP: Patient, No Pcp Per   Patient coming from: Memory Care unit    Chief Complaint: Pain on the right hip  HPI: Lynn Cameron is a 84 y.o. female with medical history significant of advanced dementia currently residing in memory care, frequent falls, hypothyroidism, anxiety, bladder cancer who was brought to the emergency department after she complained of right hip pain.  There was no report of witnessed fall.  All the history was taken from the daughter as patient is unable to contribute because of advanced dementia.  Patient is oriented to herself only.  As per the daughter, she was apparently all right until yesterday morning.  During the afternoon time, patient complained of pain on her right hip and upper thigh.  Staff in the memory care unit noticed severe tenderness on the right hip area.  Patient has history of frequent falls.  She usually ambulate with the help of walker and walks very often.  It was suspected that patient might have hit herself on the wall while she was walking with a walker which could have contributed to the fracture.  X-ray done in the memory care unit suggested right femoral neck fracture and she was sent for admission. Patient seen and examined the bedside in the emergency department.  She was found to be comfortable. There was no report of other complaints like chest pain, shortness of breath, fever, nausea, vomiting, diarrhea, headache or dysuria..  ED Course: X-ray done in the emergency department confirmed right femoral neck fracture.  Orthopedics consulted.  Hospitalist team called for admission.  Patient to be transferred to [facility] for surgery tomorrow .  Review of Systems: As per HPI otherwise 10 point review of systems negative.    Past Medical History:  Diagnosis Date  . High cholesterol   . Hypertension   . Thyroid disease     History reviewed. No  pertinent surgical history.   reports that she has never smoked. She has never used smokeless tobacco. She reports that she does not drink alcohol or use drugs.  No Known Allergies  History reviewed. No pertinent family history.   Prior to Admission medications   Medication Sig Start Date End Date Taking? Authorizing Provider  acetaminophen (TYLENOL) 500 MG tablet Take 500 mg by mouth 4 (four) times daily.    Yes [provider]  ALPRAZolam Duanne Moron) 0.5 MG tablet Take 0.5 mg by mouth 2 (two) times daily as needed for anxiety.   Yes [provider]  cycloSPORINE (RESTASIS) 0.05 % ophthalmic emulsion Place 1 drop into both eyes 2 (two) times daily.   Yes [provider]  levothyroxine (SYNTHROID, LEVOTHROID) 50 MCG tablet Take 50 mcg by mouth daily before breakfast.   Yes [provider]  LORazepam (ATIVAN) 0.5 MG tablet Take 0.5 mg by mouth 2 (two) times daily.   Yes [provider]  Mineral Oil Heavy OIL Place 2 drops into both ears once a week.   Yes [provider]  sertraline (ZOLOFT) 100 MG tablet Take 100 mg by mouth daily.   Yes [provider]  traZODone (DESYREL) 50 MG tablet Take 50 mg by mouth at bedtime.   Yes [provider]    Physical Exam: Vitals:   03/06/18 1444 03/06/18 1558  BP: 133/76 (!) 156/69  Pulse: 89 99  Resp: 18 18  Temp: 97.9 F (36.6 C)   TempSrc: Oral  SpO2: 95% 96%    Constitutional: NAD, calm, comfortable,elderly female Vitals:   03/06/18 1444 03/06/18 1558  BP: 133/76 (!) 156/69  Pulse: 89 99  Resp: 18 18  Temp: 97.9 F (36.6 C)   TempSrc: Oral   SpO2: 95% 96%   Eyes: PERRL, lids and conjunctivae normal ENMT: Mucous membranes are moist. Posterior pharynx clear of any exudate or lesions.Normal dentition.  Neck: normal, supple, no masses, no thyromegaly Respiratory: clear to auscultation bilaterally, no wheezing, no crackles. Normal respiratory effort. No accessory  muscle use.  Cardiovascular: Regular rate and rhythm, no murmurs / rubs / gallops. No extremity edema. 2+ pedal pulses. No carotid bruits.  Abdomen: no tenderness, no masses palpated. No hepatosplenomegaly. Bowel sounds positive.  Musculoskeletal: no clubbing / cyanosis.  Severe tenderness on the right upper thigh area, no visible erythema or obvious swelling.  Decreased range of motion of the right hip joint. Skin: no rashes, lesions No induration.  Superficial ulceration on the left call covered with dressing Neurologic: CN 2-12 grossly intact. Sensation intact, DTR normal. Strength 5/5 in all 4.  Psychiatric: Normal judgment and insight impaired.  Oriented to self only Foley Catheter:None  Labs on Admission: I have personally reviewed following labs and imaging studies  CBC: Recent Labs  Lab 03/06/18 1614  WBC 11.8*  NEUTROABS 9.5*  HGB 11.8*  HCT 36.8  MCV 90.2  PLT 149   Basic Metabolic Panel: Recent Labs  Lab 03/06/18 1614  NA 143  K 4.1  CL 104  CO2 27  GLUCOSE 115*  BUN 23  CREATININE 0.80  CALCIUM 9.0   GFR: CrCl cannot be calculated (Unknown ideal weight.). Liver Function Tests: No results for input(s): AST, ALT, ALKPHOS, BILITOT, PROT, ALBUMIN in the last 168 hours. No results for input(s): LIPASE, AMYLASE in the last 168 hours. No results for input(s): AMMONIA in the last 168 hours. Coagulation Profile: Recent Labs  Lab 03/06/18 1614  INR 0.86   Cardiac Enzymes: No results for input(s): CKTOTAL, CKMB, CKMBINDEX, TROPONINI in the last 168 hours. BNP (last 3 results) No results for input(s): PROBNP in the last 8760 hours. HbA1C: No results for input(s): HGBA1C in the last 72 hours. CBG: No results for input(s): GLUCAP in the last 168 hours. Lipid Profile: No results for input(s): CHOL, HDL, LDLCALC, TRIG, CHOLHDL, LDLDIRECT in the last 72 hours. Thyroid Function Tests: No results for input(s): TSH, T4TOTAL, FREET4, T3FREE, THYROIDAB in the last 72  hours. Anemia Panel: No results for input(s): VITAMINB12, FOLATE, FERRITIN, TIBC, IRON, RETICCTPCT in the last 72 hours. Urine analysis: No results found for: COLORURINE, APPEARANCEUR, [place], [place], GLUCOSEU, HGBUR, BILIRUBINUR, KETONESUR, PROTEINUR, UROBILINOGEN, NITRITE, LEUKOCYTESUR  Radiological Exams on Admission: Dg Hip Unilat W Or Wo Pelvis 2-3 Views Right  Result Date: 03/06/2018 CLINICAL DATA:  Fall several weeks ago with recent fracture on x-ray EXAM: DG HIP (WITH OR WITHOUT PELVIS) 3V RIGHT COMPARISON:  None. FINDINGS: Pelvic ring is intact. There is a subcapital right femoral neck fracture with impaction and angulation at the fracture site. No soft tissue changes are noted. IMPRESSION: Right femoral neck fracture Electronically Signed   By: Inez Catalina M.D.   On: 03/06/2018 15:44     Assessment/Plan Principal Problem:   Fracture of femoral neck, right (HCC) Active Problems:   Dementia (HCC)   Hypothyroidism   Anxiety   Hip fracture (HCC)  Fracture of right femoral neck: Orthopedics already consulted.  She will be transferred to [facility] for surgery tomorrow.  DVT  prophylaxis with Lovenox will be started after surgery.  PT/OT should be consulted after the surgery.  She will most likely need skilled nursing facility on discharge.Social worker will be consulted. Continue pain management.  Dementia: Pretty advanced.  Patient is oriented to self only.  She lives in memory care.  Continue supportive care.  Hypothyroidism: Continue Synthyroid  Anxiety: Continue Ativan at home dose.  Also on sertraline.  History of bladder cancer: Diagnosed about 2 years ago.  Status post resection and 6 weeks of radiation therapy.  Currently in remission.  Does not follow-up with urology currently.  Severity of Illness: The appropriate patient status for this patient is INPATIENT   DVT prophylaxis: SCD Code Status: Full Family Communication: Discussed with the daughter Consults called:  Orthopedics called by the emergency physician     Shelly Coss MD [facility] Pager [phone]  If 7PM-7AM, please contact night-coverage [email or web address] Password [email or web address]  03/06/2018, 5:51 PM

## 2018-03-06 NOTE — ED Notes (Signed)
ED TO INPATIENT HANDOFF REPORT  Name/Age/Gender Lynn Cameron 84 y.o. female  Code Status    Code Status Orders  (From admission, onward)         Start     Ordered   03/06/18 1751  Full code  Continuous     03/06/18 1751        Code Status History    This patient has a current code status but no historical code status.      Home/SNF/Other Nursing Home  Chief Complaint Hip Fracture (Right)  Level of Care/Admitting Diagnosis ED Disposition    ED Disposition Condition [facility]: [place] [[id number]]  Level of Care: Med-Surg [16]  Diagnosis: Hip fracture [facility]) [[id number]]  Admitting Physician: Shelly Coss [[id number]]  Attending Physician: Shelly Coss [[id number]]  Estimated length of stay: past midnight tomorrow  Certification:: I certify this patient will need inpatient services for at least 2 midnights  PT Class (Do Not Modify): Inpatient [101]  PT Acc Code (Do Not Modify): Private [1]       Medical History Past Medical History:  Diagnosis Date  . Alzheimer's dementia ([place])   . High cholesterol   . Hypertension   . Thyroid disease     Allergies No Known Allergies  IV Location/Drains/Wounds Patient Lines/Drains/Airways Status   Active Line/Drains/Airways    Name:   Placement date:   Placement time:   Site:   Days:   Peripheral IV 03/06/18 Left Antecubital   03/06/18    1614    Antecubital   less than 1          Labs/Imaging Results for orders placed or performed during the hospital encounter of 03/06/18 (from the past 48 hour(s))  Basic metabolic panel     Status: Abnormal   Collection Time: 03/06/18  4:14 PM  Result Value Ref Range   Sodium 143 135 - 145 mmol/L   Potassium 4.1 3.5 - 5.1 mmol/L   Chloride 104 98 - 111 mmol/L   CO2 27 22 - 32 mmol/L   Glucose, Bld 115 (H) 70 - 99 mg/dL   BUN 23 8 - 23 mg/dL   Creatinine, Ser 0.80 0.44 - 1.00 mg/dL   Calcium 9.0 8.9 - 10.3 mg/dL   GFR calc non Af  Amer >60 >60 mL/min   GFR calc Af Amer >60 >60 mL/min    Comment: (NOTE) The eGFR has been calculated using the CKD EPI equation. This calculation has not been validated in all clinical situations. eGFR's persistently <60 mL/min signify possible Chronic Kidney Disease.    Anion gap 12 5 - 15    Comment: Performed at [facility], [place] [address], [place], [place] [address]  CBC with Differential     Status: Abnormal   Collection Time: 03/06/18  4:14 PM  Result Value Ref Range   WBC 11.8 (H) 4.0 - 10.5 K/uL   RBC 4.08 3.87 - 5.11 MIL/uL   Hemoglobin 11.8 (L) 12.0 - 15.0 g/dL   HCT 36.8 36.0 - 46.0 %   MCV 90.2 80.0 - 100.0 fL   MCH 28.9 26.0 - 34.0 pg   MCHC 32.1 30.0 - 36.0 g/dL   RDW 13.4 11.5 - 15.5 %   Platelets 314 150 - 400 K/uL   nRBC 0.0 0.0 - 0.2 %   Neutrophils Relative % 80 %   Neutro Abs 9.5 (H) 1.7 - 7.7 K/uL   Lymphocytes Relative 7 %  Lymphs Abs 0.8 0.7 - 4.0 K/uL   Monocytes Relative 10 %   Monocytes Absolute 1.2 (H) 0.1 - 1.0 K/uL   Eosinophils Relative 2 %   Eosinophils Absolute 0.2 0.0 - 0.5 K/uL   Basophils Relative 0 %   Basophils Absolute 0.0 0.0 - 0.1 K/uL   Immature Granulocytes 1 %   Abs Immature Granulocytes 0.09 (H) 0.00 - 0.07 K/uL    Comment: Performed at [facility], [place] [address] [place], [place] [address]  Protime-INR     Status: None   Collection Time: 03/06/18  4:14 PM  Result Value Ref Range   Prothrombin Time 11.6 11.4 - 15.2 seconds   INR 0.86     Comment: Performed at [facility], [place] [address], [place], [place] [address]   Dg Hip Unilat W Or Wo Pelvis 2-3 Views Right  Result Date: 03/06/2018 CLINICAL DATA:  Fall several weeks ago with recent fracture on x-ray EXAM: DG HIP (WITH OR WITHOUT PELVIS) 3V RIGHT COMPARISON:  None. FINDINGS: Pelvic ring is intact. There is a subcapital right femoral neck fracture with impaction and angulation at the fracture site. No soft tissue  changes are noted. IMPRESSION: Right femoral neck fracture Electronically Signed   By: Inez Catalina M.D.   On: 03/06/2018 15:44    Pending Labs Unresulted Labs (From admission, onward)    Start     Ordered   03/07/18 3545  Basic metabolic panel  Tomorrow morning,   R     03/06/18 1751   03/07/18 0500  CBC  Tomorrow morning,   R     03/06/18 1751   03/06/18 1525  Type and screen Colon  STAT,   STAT    Comments:  [place]    03/06/18 1524          Vitals/Pain Today's Vitals   03/06/18 1444 03/06/18 1558 03/06/18 1600  BP: 133/76 (!) 156/69 108/74  Pulse: 89 99 95  Resp: 18 18 (!) 23  Temp: 97.9 F (36.6 C)    TempSrc: Oral    SpO2: 95% 96% 91%    Isolation Precautions No active isolations  Medications Medications  morphine 2 MG/ML injection 2 mg (has no administration in time range)  oxyCODONE-acetaminophen (PERCOCET/ROXICET) 5-325 MG per tablet 1 tablet (has no administration in time range)  0.9 %  sodium chloride infusion (has no administration in time range)  LORazepam (ATIVAN) tablet 0.5 mg (has no administration in time range)  sertraline (ZOLOFT) tablet 100 mg (has no administration in time range)  traZODone (DESYREL) tablet 50 mg (has no administration in time range)  levothyroxine (SYNTHROID, LEVOTHROID) tablet 50 mcg (has no administration in time range)    Mobility walks with device .

## 2018-03-06 NOTE — ED Notes (Signed)
Bed: WA07 Expected date:  Expected time:  Means of arrival:  Comments: 84 yo hip injury

## 2018-03-06 NOTE — ED Triage Notes (Signed)
Per EMS pt from [facility]. EMS reports facility paperwork pt had a fall on 9/23 and had been unable to bear weight on leg or straighten it out since. Xray came to nursing home and shows positive hip fracture. Pt normally ambulatory with walker.

## 2018-03-06 NOTE — ED Provider Notes (Signed)
[facility] DEPT Provider Note   CSN: [id number] Arrival date & time: 03/06/18  1433     History   Chief Complaint No chief complaint on file.   HPI Ona Roehrs is a 84 y.o. female medical history of hypertension, high cholesterol brought in by EMS from [facility] for evaluation of right femur fracture.  EMS reports that patient had an x-ray today that showed a acute impacted right femoral neck fracture.  Per nursing home, they have no record of patient falling but on the x-ray report, it does state that patient fell on zero 9/2 3 and nursing home reports that since then, she has been unable to bear weight which is abnormal for her.  At baseline, she walks with the assistance of a walker.   EM LEVEL 5 CAVEAT   The history is provided by the EMS personnel.    Past Medical History:  Diagnosis Date  . High cholesterol   . Hypertension   . Thyroid disease     Patient Active Problem List   Diagnosis Date Noted  . Protein-calorie malnutrition ([place]) 12/01/2017    History reviewed. No pertinent surgical history.   OB History   None      Home Medications    Prior to Admission medications   Medication Sig Start Date End Date Taking? Authorizing Provider  acetaminophen (TYLENOL) 500 MG tablet Take 500 mg by mouth 4 (four) times daily.    Yes [provider]  ALPRAZolam Duanne Moron) 0.5 MG tablet Take 0.5 mg by mouth 2 (two) times daily as needed for anxiety.   Yes [provider]  cycloSPORINE (RESTASIS) 0.05 % ophthalmic emulsion Place 1 drop into both eyes 2 (two) times daily.   Yes [provider]  levothyroxine (SYNTHROID, LEVOTHROID) 50 MCG tablet Take 50 mcg by mouth daily before breakfast.   Yes [provider]  LORazepam (ATIVAN) 0.5 MG tablet Take 0.5 mg by mouth 2 (two) times daily.   Yes [provider]  Mineral Oil Heavy OIL Place 2 drops into both ears once a week.   Yes  [provider]  sertraline (ZOLOFT) 100 MG tablet Take 100 mg by mouth daily.   Yes [provider]  traZODone (DESYREL) 50 MG tablet Take 50 mg by mouth at bedtime.   Yes [provider]    Family History History reviewed. No pertinent family history.  Social History Social History   Tobacco Use  . Smoking status: Never Smoker  . Smokeless tobacco: Never Used  Substance Use Topics  . Alcohol use: No    Frequency: Never  . Drug use: No     Allergies   Patient has no known allergies.   Review of Systems Review of Systems  Unable to perform ROS: Mental status change     Physical Exam Updated Vital Signs BP (!) 156/69   Pulse 99   Temp 97.9 F (36.6 C) (Oral)   Resp 18   SpO2 96%   Physical Exam  Constitutional: She appears well-developed and well-nourished.  HENT:  Head: Normocephalic and atraumatic.  Mouth/Throat: Oropharynx is clear and moist and mucous membranes are normal.  Eyes: Pupils are equal, round, and reactive to light. Conjunctivae, EOM and lids are normal.  Neck: Full passive range of motion without pain.  Cardiovascular: Normal rate, regular rhythm, normal heart sounds and normal pulses. Exam reveals no gallop and no friction rub.  No murmur heard. Pulses:  Radial pulses are 2+ on the right side, and 2+ on the left side.       Dorsalis pedis pulses are 2+ on the right side, and 2+ on the left side.  Pulmonary/Chest: Effort normal and breath sounds normal.  Abdominal: Soft. Normal appearance. There is no tenderness. There is no rigidity and no guarding.  Musculoskeletal: Normal range of motion.  Tenderness palpation noted to the proximal right thigh.  Compartments are soft.  No deformity or crepitus.  No ecchymosis.  No tenderness palpation noted to right knee, right ankle.  Full range of motion of left lower extremity with any difficulty.  Limited flexion/extension of right lower extremity secondary to pain.  Right  lower extremity appears slightly shortened.  Neurological: She is alert.  Sensation intact along major nerve distributions of BLE Alert and oriented x1.  She knows her name but cannot tell me where she is or what year it is.  Skin: Skin is warm and dry. Capillary refill takes less than 2 seconds.  Good distal cap refill. RLE is not dusky in appearance or cool to touch.  Psychiatric: She has a normal mood and affect. Her speech is normal.  Nursing note and vitals reviewed.    ED Treatments / Results  Labs (all labs ordered are listed, but only abnormal results are displayed) Labs Reviewed  BASIC METABOLIC PANEL - Abnormal; Notable for the following components:      Result Value   Glucose, Bld 115 (*)    All other components within normal limits  CBC WITH DIFFERENTIAL/PLATELET - Abnormal; Notable for the following components:   WBC 11.8 (*)    Hemoglobin 11.8 (*)    Neutro Abs 9.5 (*)    Monocytes Absolute 1.2 (*)    Abs Immature Granulocytes 0.09 (*)    All other components within normal limits  PROTIME-INR  TYPE AND SCREEN    EKG None  Radiology Dg Hip Unilat W Or Wo Pelvis 2-3 Views Right  Result Date: 03/06/2018 CLINICAL DATA:  Fall several weeks ago with recent fracture on x-ray EXAM: DG HIP (WITH OR WITHOUT PELVIS) 3V RIGHT COMPARISON:  None. FINDINGS: Pelvic ring is intact. There is a subcapital right femoral neck fracture with impaction and angulation at the fracture site. No soft tissue changes are noted. IMPRESSION: Right femoral neck fracture Electronically Signed   By: Inez Catalina M.D.   On: 03/06/2018 15:44    Procedures Procedures (including critical care time)  Medications Ordered in ED Medications - No data to display   Initial Impression / Assessment and Plan / ED Course  I have reviewed the triage vital signs and the nursing notes.  Pertinent labs & imaging results that were available during my care of the patient were reviewed by me and considered  in my medical decision making (see chart for details).     84 year old female brought in by EMS from [facility] for evaluation of right femur fracture.  Per x-ray report, patient sustained a fall on 9/23 has been unable to bear weight since then.  X-ray today showed a right femoral neck fracture with impaction.  She was sent to the ED for further evaluation. Patient is afebrile, non-toxic appearing, sitting comfortably on examination table. Vital signs reviewed and stable. Patient is neurovascularly intact.  On exam, right lower extremity appears slightly shortened.  No rotation.  Tenderness palpation with limited range of motion.  We will repeat x-rays since we do not have access to imaging  in our system.  Will consult orthopedics.  Discussed with daughter at bedside.  He states that patient has been able to ambulate with the assistance of her walker since the 23rd.  She states that yesterday, she visited patient at the nursing home and stated that she was able to walk with the assistance of her walker.  Nursing home called daughter and reported that after breakfast, she had been complaining of some pain was having difficulty bearing weight.  Daughter thinks she may have fallen sometime yesterday.  She reports that patient has no diagnosed history of dementia but she does report that patient is often only oriented to person.  She states that is her baseline.  X-ray shows a right femoral neck fracture.  We will consult with orthopedics.  PT/INR shows INR of 0.6.  CBC shows leukocytosis of 11.8.  Hemoglobin is 11.8.  BMP is unremarkable.  Discussed with Dr. Lucia Gaskins (Ortho).  Agrees with plan for admission.  He would like medicine admission with orthopedic consult.  He will plan for surgery tomorrow morning at [facility].  Would like patient transferred to [facility].  Recommends n.p.o. after midnight.  Discussed with hospitalist. Will accept patient for admission.   Final Clinical  Impressions(s) / ED Diagnoses   Final diagnoses:  Closed fracture of neck of right femur, initial encounter [facility])    ED Discharge Orders    None       Desma Mcgregor 03/06/18 Jasmine Awe, MD 03/07/18 1527

## 2018-03-06 NOTE — ED Notes (Signed)
Elias-Fela Solis (Daughter)

## 2018-03-07 ENCOUNTER — Inpatient Hospital Stay (HOSPITAL_COMMUNITY): Payer: Medicare HMO | Admitting: Registered Nurse

## 2018-03-07 ENCOUNTER — Encounter (HOSPITAL_COMMUNITY)
Admission: EM | Disposition: A | Discharge status: Skilled Nursing Facility | Payer: Self-pay | Source: Skilled Nursing Facility | Attending: Internal Medicine

## 2018-03-07 ENCOUNTER — Inpatient Hospital Stay (HOSPITAL_COMMUNITY): Payer: Medicare HMO

## 2018-03-07 ENCOUNTER — Other Ambulatory Visit: Payer: Self-pay

## 2018-03-07 ENCOUNTER — Encounter (HOSPITAL_COMMUNITY): Payer: Self-pay

## 2018-03-07 HISTORY — PX: PERCUTANEOUS PINNING FEMORAL NECK FRACTURE: SUR1014

## 2018-03-07 HISTORY — PX: PERCUTANEOUS PINNING: SHX2209

## 2018-03-07 LAB — ABO/RH
ABO/RH(D): B POS
ABO/RH(D): B POS

## 2018-03-07 LAB — GLUCOSE, CAPILLARY: Glucose-Capillary: 109 mg/dL — ABNORMAL HIGH (ref 70–99)

## 2018-03-07 LAB — TYPE AND SCREEN
ABO/RH(D): B POS
ANTIBODY SCREEN: NEGATIVE

## 2018-03-07 SURGERY — PINNING, EXTREMITY, PERCUTANEOUS
Anesthesia: General | Site: Hip | Laterality: Right

## 2018-03-07 MED ORDER — PROPOFOL 10 MG/ML IV BOLUS
INTRAVENOUS | Status: DC | PRN
  Administered 2018-03-07: 60 mg via INTRAVENOUS
  Administered 2018-03-07: 90 mg via INTRAVENOUS

## 2018-03-07 MED ORDER — ENOXAPARIN SODIUM 40 MG/0.4ML ~~LOC~~ SOLN: 40.0000 mg | SUBCUTANEOUS | Status: DC

## 2018-03-07 MED ORDER — ONDANSETRON HCL 4 MG/2ML IJ SOLN
INTRAMUSCULAR | Status: DC | PRN
  Administered 2018-03-07: 4 mg via INTRAVENOUS

## 2018-03-07 MED ORDER — CEFAZOLIN SODIUM-DEXTROSE 2-4 GM/100ML-% IV SOLN
2.0000 g | Freq: Four times a day (QID) | INTRAVENOUS | Status: AC
  Administered 2018-03-07 – 2018-03-08 (×3): 2 g via INTRAVENOUS
  Filled 2018-03-07 (×3): qty 100

## 2018-03-07 MED ORDER — FENTANYL CITRATE (PF) 100 MCG/2ML IJ SOLN: 25.0000 ug | INTRAMUSCULAR | Status: DC | PRN

## 2018-03-07 MED ORDER — ONDANSETRON HCL 4 MG/2ML IJ SOLN: 4.0000 mg | Freq: Four times a day (QID) | INTRAMUSCULAR | Status: DC | PRN

## 2018-03-07 MED ORDER — FENTANYL CITRATE (PF) 250 MCG/5ML IJ SOLN
INTRAMUSCULAR | Status: AC
  Filled 2018-03-07: qty 5

## 2018-03-07 MED ORDER — SUCCINYLCHOLINE CHLORIDE 200 MG/10ML IV SOSY
PREFILLED_SYRINGE | INTRAVENOUS | Status: AC
  Filled 2018-03-07: qty 10

## 2018-03-07 MED ORDER — EPHEDRINE 5 MG/ML INJ
INTRAVENOUS | Status: AC
  Filled 2018-03-07: qty 20

## 2018-03-07 MED ORDER — DOCUSATE SODIUM 100 MG PO CAPS
100.0000 mg | ORAL_CAPSULE | Freq: Two times a day (BID) | ORAL | Status: DC
  Administered 2018-03-09: 100 mg via ORAL
  Filled 2018-03-07 (×4): qty 1

## 2018-03-07 MED ORDER — ALBUMIN HUMAN 5 % IV SOLN
INTRAVENOUS | Status: DC | PRN
  Administered 2018-03-07: 13:00:00 via INTRAVENOUS

## 2018-03-07 MED ORDER — DEXAMETHASONE SODIUM PHOSPHATE 10 MG/ML IJ SOLN
INTRAMUSCULAR | Status: DC | PRN
  Administered 2018-03-07: 4 mg via INTRAVENOUS

## 2018-03-07 MED ORDER — SERTRALINE HCL 100 MG PO TABS
100.0000 mg | ORAL_TABLET | Freq: Every day | ORAL | Status: DC
  Administered 2018-03-07 – 2018-03-09 (×3): 100 mg via ORAL
  Filled 2018-03-07 (×3): qty 1

## 2018-03-07 MED ORDER — PHENYLEPHRINE 40 MCG/ML (10ML) SYRINGE FOR IV PUSH (FOR BLOOD PRESSURE SUPPORT)
PREFILLED_SYRINGE | INTRAVENOUS | Status: AC
  Filled 2018-03-07: qty 10

## 2018-03-07 MED ORDER — ONDANSETRON HCL 4 MG PO TABS: 4.0000 mg | ORAL_TABLET | Freq: Four times a day (QID) | ORAL | Status: DC | PRN

## 2018-03-07 MED ORDER — LEVOTHYROXINE SODIUM 50 MCG PO TABS
50.0000 ug | ORAL_TABLET | Freq: Every day | ORAL | Status: DC
  Administered 2018-03-07 – 2018-03-09 (×2): 50 ug via ORAL
  Filled 2018-03-07 (×3): qty 1

## 2018-03-07 MED ORDER — METOCLOPRAMIDE HCL 5 MG/ML IJ SOLN: 5.0000 mg | Freq: Three times a day (TID) | INTRAMUSCULAR | Status: DC | PRN

## 2018-03-07 MED ORDER — LACTATED RINGERS IV SOLN
INTRAVENOUS | Status: DC | PRN
  Administered 2018-03-07: 11:00:00 via INTRAVENOUS

## 2018-03-07 MED ORDER — CEFAZOLIN SODIUM-DEXTROSE 2-4 GM/100ML-% IV SOLN
INTRAVENOUS | Status: AC
  Filled 2018-03-07: qty 100

## 2018-03-07 MED ORDER — SODIUM CHLORIDE 0.9 % IV SOLN
INTRAVENOUS | Status: DC | PRN
  Administered 2018-03-07: 30 ug/min via INTRAVENOUS

## 2018-03-07 MED ORDER — PROPOFOL 10 MG/ML IV BOLUS
INTRAVENOUS | Status: AC
  Filled 2018-03-07: qty 20

## 2018-03-07 MED ORDER — PHENYLEPHRINE HCL 10 MG/ML IJ SOLN
INTRAMUSCULAR | Status: DC | PRN
  Administered 2018-03-07 (×3): 80 ug via INTRAVENOUS

## 2018-03-07 MED ORDER — LIDOCAINE 2% (20 MG/ML) 5 ML SYRINGE
INTRAMUSCULAR | Status: AC
  Filled 2018-03-07: qty 10

## 2018-03-07 MED ORDER — METOCLOPRAMIDE HCL 5 MG PO TABS: 5.0000 mg | ORAL_TABLET | Freq: Three times a day (TID) | ORAL | Status: DC | PRN

## 2018-03-07 MED ORDER — DEXMEDETOMIDINE HCL 200 MCG/2ML IV SOLN
INTRAVENOUS | Status: DC | PRN
  Administered 2018-03-07 (×3): 4 ug via INTRAVENOUS

## 2018-03-07 MED ORDER — ONDANSETRON HCL 4 MG/2ML IJ SOLN: 4.0000 mg | Freq: Once | INTRAMUSCULAR | Status: DC | PRN

## 2018-03-07 MED ORDER — ACETAMINOPHEN 10 MG/ML IV SOLN: 1000.0000 mg | Freq: Once | INTRAVENOUS | Status: DC | PRN

## 2018-03-07 MED ORDER — LACTATED RINGERS IV SOLN: INTRAVENOUS | Status: DC

## 2018-03-07 MED ORDER — 0.9 % SODIUM CHLORIDE (POUR BTL) OPTIME
TOPICAL | Status: DC | PRN
  Administered 2018-03-07: 1000 mL

## 2018-03-07 MED ORDER — EPHEDRINE SULFATE 50 MG/ML IJ SOLN
INTRAMUSCULAR | Status: DC | PRN
  Administered 2018-03-07 (×2): 10 mg via INTRAVENOUS

## 2018-03-07 MED ORDER — FENTANYL CITRATE (PF) 250 MCG/5ML IJ SOLN
INTRAMUSCULAR | Status: DC | PRN
  Administered 2018-03-07 (×6): 25 ug via INTRAVENOUS

## 2018-03-07 MED ORDER — LIDOCAINE 2% (20 MG/ML) 5 ML SYRINGE
INTRAMUSCULAR | Status: DC | PRN
  Administered 2018-03-07: 40 mg via INTRAVENOUS

## 2018-03-07 MED ORDER — CEFAZOLIN SODIUM-DEXTROSE 2-4 GM/100ML-% IV SOLN
2.0000 g | INTRAVENOUS | Status: AC
  Administered 2018-03-07: 2 g via INTRAVENOUS

## 2018-03-07 SURGICAL SUPPLY — 50 items
BIT DRILL CANN LRG QC 5X300 (BIT) ×3 IMPLANT
BLADE SURG 10 STRL SS (BLADE) ×3 IMPLANT
BNDG COHESIVE 4X5 TAN STRL (GAUZE/BANDAGES/DRESSINGS) ×3 IMPLANT
BNDG GAUZE ELAST 4 BULKY (GAUZE/BANDAGES/DRESSINGS) ×3 IMPLANT
CHLORAPREP W/TINT 26ML (MISCELLANEOUS) ×3 IMPLANT
COVER MAYO STAND STRL (DRAPES) ×3 IMPLANT
COVER PERINEAL POST (MISCELLANEOUS) ×3 IMPLANT
COVER SURGICAL LIGHT HANDLE (MISCELLANEOUS) ×3 IMPLANT
COVER WAND RF STERILE (DRAPES) ×3 IMPLANT
DERMABOND ADVANCED (GAUZE/BANDAGES/DRESSINGS) ×2
DERMABOND ADVANCED .7 DNX12 (GAUZE/BANDAGES/DRESSINGS) ×1 IMPLANT
DRAPE ORTHO SPLIT 77X108 STRL (DRAPES) ×4
DRAPE STERI IOBAN 125X83 (DRAPES) ×3 IMPLANT
DRAPE SURG 17X23 STRL (DRAPES) ×12 IMPLANT
DRAPE SURG ORHT 6 SPLT 77X108 (DRAPES) ×2 IMPLANT
DRSG MEPILEX BORDER 4X4 (GAUZE/BANDAGES/DRESSINGS) ×3 IMPLANT
DRSG PAD ABDOMINAL 8X10 ST (GAUZE/BANDAGES/DRESSINGS) ×9 IMPLANT
DRSG TEGADERM 4X4.75 (GAUZE/BANDAGES/DRESSINGS) ×3 IMPLANT
ELECT REM PT RETURN 9FT ADLT (ELECTROSURGICAL) ×3
ELECTRODE REM PT RTRN 9FT ADLT (ELECTROSURGICAL) ×1 IMPLANT
GAUZE SPONGE 2X2 8PLY STRL LF (GAUZE/BANDAGES/DRESSINGS) ×1 IMPLANT
GLOVE BIO SURGEON STRL SZ7 (GLOVE) ×3 IMPLANT
GLOVE BIO SURGEON STRL SZ7.5 (GLOVE) ×3 IMPLANT
GLOVE BIOGEL PI IND STRL 8 (GLOVE) ×1 IMPLANT
GLOVE BIOGEL PI INDICATOR 8 (GLOVE) ×2
GOWN STRL REUS W/ TWL LRG LVL3 (GOWN DISPOSABLE) ×2 IMPLANT
GOWN STRL REUS W/ TWL XL LVL3 (GOWN DISPOSABLE) ×2 IMPLANT
GOWN STRL REUS W/TWL LRG LVL3 (GOWN DISPOSABLE) ×4
GOWN STRL REUS W/TWL XL LVL3 (GOWN DISPOSABLE) ×4
GUIDEWIRE BALL NOSE 80CM (WIRE) ×3 IMPLANT
GUIDEWIRE THREADED 2.8 (WIRE) ×9 IMPLANT
KIT TURNOVER KIT B (KITS) ×3 IMPLANT
LINER BOOT UNIVERSAL DISP (MISCELLANEOUS) ×3 IMPLANT
MANIFOLD NEPTUNE II (INSTRUMENTS) ×3 IMPLANT
NS IRRIG 1000ML POUR BTL (IV SOLUTION) ×3 IMPLANT
PACK GENERAL/GYN (CUSTOM PROCEDURE TRAY) ×3 IMPLANT
PAD ARMBOARD 7.5X6 YLW CONV (MISCELLANEOUS) ×6 IMPLANT
PAD CAST 4YDX4 CTTN HI CHSV (CAST SUPPLIES) ×1 IMPLANT
PADDING CAST COTTON 4X4 STRL (CAST SUPPLIES) ×2
SCREW CANN 16 THRD/75 7.3 (Screw) ×6 IMPLANT
SCREW CANN 16 THRD/80 7.3 (Screw) ×3 IMPLANT
SPONGE GAUZE 2X2 STER 10/PKG (GAUZE/BANDAGES/DRESSINGS) ×2
STAPLER VISISTAT 35W (STAPLE) ×3 IMPLANT
SUT VIC AB 1 CT1 27 (SUTURE) ×2
SUT VIC AB 1 CT1 27XBRD ANBCTR (SUTURE) ×1 IMPLANT
SUT VIC AB 2-0 CT1 27 (SUTURE) ×2
SUT VIC AB 2-0 CT1 TAPERPNT 27 (SUTURE) ×1 IMPLANT
TOWEL OR 17X24 6PK STRL BLUE (TOWEL DISPOSABLE) ×3 IMPLANT
TOWEL OR 17X26 10 PK STRL BLUE (TOWEL DISPOSABLE) ×3 IMPLANT
WATER STERILE IRR 1000ML POUR (IV SOLUTION) ×3 IMPLANT

## 2018-03-07 NOTE — Anesthesia Preprocedure Evaluation (Signed)
Anesthesia Evaluation  Patient identified by MRN, date of birth, ID band Patient confused    Reviewed: Allergy & Precautions, NPO status , Patient's Chart, lab work & pertinent test results  Airway Mallampati: II  TM Distance: >3 FB Neck ROM: Full    Dental no notable dental hx. (+) Upper Dentures, Lower Dentures   Pulmonary neg pulmonary ROS,    Pulmonary exam normal breath sounds clear to auscultation       Cardiovascular hypertension, negative cardio ROS Normal cardiovascular exam Rhythm:Regular Rate:Normal     Neuro/Psych Dementia negative neurological ROS     GI/Hepatic   Endo/Other  Hypothyroidism   Renal/GU      Musculoskeletal negative musculoskeletal ROS (+)   Abdominal   Peds  Hematology   Anesthesia Other Findings   Reproductive/Obstetrics                             Anesthesia Physical Anesthesia Plan  ASA: III  Anesthesia Plan: General   Post-op Pain Management:    Induction: Intravenous  PONV Risk Score and Plan: 3 and Treatment may vary due to age or medical condition, Ondansetron and Dexamethasone  Airway Management Planned: LMA  Additional Equipment:   Intra-op Plan:   Post-operative Plan: Extubation in OR  Informed Consent: I have reviewed the patients History and Physical, chart, labs and discussed the procedure including the risks, benefits and alternatives for the proposed anesthesia with the patient or authorized representative who has indicated his/her understanding and acceptance.   Dental advisory given  Plan Discussed with: CRNA  Anesthesia Plan Comments:         Anesthesia Quick Evaluation

## 2018-03-07 NOTE — Transfer of Care (Signed)
Immediate Anesthesia Transfer of Care Note  Patient: Lynn Cameron  Procedure(s) Performed: PERCUTANEOUS PINNING OF RIGHT FEMORAL NECK (Right Hip)  Patient Location: PACU  Anesthesia Type:General  Level of Consciousness: awake, alert  and oriented  Airway & Oxygen Therapy: Patient Spontanous Breathing and Patient connected to face mask oxygen  Post-op Assessment: Report given to RN and Post -op Vital signs reviewed and stable  Post vital signs: Reviewed and stable  Last Vitals:  Vitals Value Taken Time  BP 167/77 03/07/2018 12:34 PM  Temp    Pulse 75 03/07/2018 12:38 PM  Resp 15 03/07/2018 12:38 PM  SpO2 100 % 03/07/2018 12:38 PM  Vitals shown include unvalidated device data.  Last Pain:  Vitals:   03/07/18 0408  TempSrc: Oral         Complications: No apparent anesthesia complications

## 2018-03-07 NOTE — Anesthesia Procedure Notes (Signed)
Procedure Name: LMA Insertion Date/Time: 03/07/2018 11:32 AM Performed by: Jearld Pies, CRNA Pre-anesthesia Checklist: Patient identified, Emergency Drugs available, Suction available and Patient being monitored Patient Re-evaluated:Patient Re-evaluated prior to induction Oxygen Delivery Method: Circle System Utilized Preoxygenation: Pre-oxygenation with 100% oxygen Induction Type: IV induction Ventilation: Mask ventilation without difficulty LMA: LMA inserted LMA Size: 3.0 Number of attempts: 1 Airway Equipment and Method: Bite block Placement Confirmation: positive ETCO2 Tube secured with: Tape Dental Injury: Teeth and Oropharynx as per pre-operative assessment

## 2018-03-07 NOTE — Progress Notes (Signed)
Patient ID: Lisl Slingerland, female   DOB: 1926-08-25, 84 y.o.   MRN: [id number]  PROGRESS NOTE    Davionna Blacksher  AYT:[id number] DOB: 1926/12/14 DOA: 03/06/2018 PCP: Patient, No Pcp Per   Brief Narrative:  Patient 84 year old female with history of advanced dementia currently residing in memory care, frequent falls, hypothyroidism, anxiety, bladder cancer presented with right hip pain.  She was found to have right femoral neck fracture.  Orthopedics was consulted.   Assessment & Plan:   Principal Problem:   Fracture of femoral neck, right (HCC) Active Problems:   Dementia (HCC)   Hypothyroidism   Anxiety   Hip fracture (HCC)  Right femoral neck fracture -Orthopedics has been consulted.  Plan for probable OR today.  N.p.o. -Pain management -Fall precautions  Dementia -Pretty advanced.  Monitor.  Patient lives in memory care facility currently -If condition worsens postoperatively, consider hospice/comfort measures  Hypothyroidism -Continue Synthroid  Anxiety -Continue sertraline.  History of bladder cancer -Status post resection and 6 weeks of radiation therapy.  Currently in remission    DVT prophylaxis: SCDs Code Status: DNR as verified from prior notes and DNR form in the chart Family Communication: None at bedside Disposition Plan: Depends on clinical outcome  Consultants: Orthopedics  Procedures: None  Antimicrobials: None   Subjective: Patient seen and examined at bedside.  She is awake but confused.  No overnight fever, nausea or vomiting  Objective: Vitals:   03/06/18 1848 03/06/18 1956 03/06/18 2103 03/07/18 0408  BP: 127/72 129/64 128/83 128/72  Pulse: 92 89 82 79  Resp: 19  16 17   Temp:  97.8 F (36.6 C) 98.2 F (36.8 C) 99.6 F (37.6 C)  TempSrc:  Oral Oral Oral  SpO2: 93% 91% 96% 90%    Intake/Output Summary (Last 24 hours) at 03/07/2018 0816 Last data filed at 03/07/2018 3557 Gross per 24 hour  Intake 780 ml  Output -  Net 780 ml    There were no vitals filed for this visit.  Examination:  General exam: Elderly female, lying in bed, awake but confused.  Very thinly built Respiratory system: Bilateral decreased breath sounds at bases Cardiovascular system: S1 & S2 heard, Rate controlled Gastrointestinal system: Abdomen is nondistended, soft and nontender. Normal bowel sounds heard. Extremities: No cyanosis, clubbing, edema       Data Reviewed: I have personally reviewed following labs and imaging studies  CBC: Recent Labs  Lab 03/06/18 1614  WBC 11.8*  NEUTROABS 9.5*  HGB 11.8*  HCT 36.8  MCV 90.2  PLT 322   Basic Metabolic Panel: Recent Labs  Lab 03/06/18 1614  NA 143  K 4.1  CL 104  CO2 27  GLUCOSE 115*  BUN 23  CREATININE 0.80  CALCIUM 9.0   GFR: CrCl cannot be calculated (Unknown ideal weight.). Liver Function Tests: No results for input(s): AST, ALT, ALKPHOS, BILITOT, PROT, ALBUMIN in the last 168 hours. No results for input(s): LIPASE, AMYLASE in the last 168 hours. No results for input(s): AMMONIA in the last 168 hours. Coagulation Profile: Recent Labs  Lab 03/06/18 1614  INR 0.86   Cardiac Enzymes: No results for input(s): CKTOTAL, CKMB, CKMBINDEX, TROPONINI in the last 168 hours. BNP (last 3 results) No results for input(s): PROBNP in the last 8760 hours. HbA1C: No results for input(s): HGBA1C in the last 72 hours. CBG: No results for input(s): GLUCAP in the last 168 hours. Lipid Profile: No results for input(s): CHOL, HDL, LDLCALC, TRIG, CHOLHDL, LDLDIRECT in the last 72 hours.  Thyroid Function Tests: No results for input(s): TSH, T4TOTAL, FREET4, T3FREE, THYROIDAB in the last 72 hours. Anemia Panel: No results for input(s): VITAMINB12, FOLATE, FERRITIN, TIBC, IRON, RETICCTPCT in the last 72 hours. Sepsis Labs: No results for input(s): PROCALCITON, LATICACIDVEN in the last 168 hours.  Recent Results (from the past 240 hour(s))  Surgical pcr screen     Status: None    Collection Time: 03/06/18  8:15 PM  Result Value Ref Range Status   MRSA, PCR NEGATIVE NEGATIVE Final   Staphylococcus aureus NEGATIVE NEGATIVE Final    Comment: (NOTE) The Xpert SA Assay (FDA approved for NASAL specimens in patients 36 years of age and older), is one component of a comprehensive surveillance program. It is not intended to diagnose infection nor to guide or monitor treatment. Performed at [facility], [place] [address] [place], [place] [address]          Radiology Studies: Dg Hip Unilat W Or Wo Pelvis 2-3 Views Right  Result Date: 03/06/2018 CLINICAL DATA:  Fall several weeks ago with recent fracture on x-ray EXAM: DG HIP (WITH OR WITHOUT PELVIS) 3V RIGHT COMPARISON:  None. FINDINGS: Pelvic ring is intact. There is a subcapital right femoral neck fracture with impaction and angulation at the fracture site. No soft tissue changes are noted. IMPRESSION: Right femoral neck fracture Electronically Signed   By: Inez Catalina M.D.   On: 03/06/2018 15:44        Scheduled Meds: . levothyroxine  50 mcg Oral QAC breakfast  . LORazepam  0.5 mg Oral BID  . sertraline  100 mg Oral Daily  . traZODone  50 mg Oral QHS   Continuous Infusions: . sodium chloride 75 mL/hr at 03/06/18 2015     LOS: 1 day        Aline August, MD [facility] Pager [phone]  If 7PM-7AM, please contact night-coverage [email or web address] Password [email or web address] 03/07/2018, 8:16 AM

## 2018-03-07 NOTE — Op Note (Signed)
  Lynn Cameron female 84 y.o. 03/07/2018  PreOperative Diagnosis: Valgus impacted right femoral neck fracture  PostOperative Diagnosis: Same   Procedure(s) and Anesthesia Type:    * PERCUTANEOUS PINNING OF RIGHT FEMORAL NECK - General  Surgeon: Erle Crocker   Assistants: none  Anesthesia: General LMA anesthesia   Findings: Valgus impacted right femoral neck fracture Implants: Synthes 7.3 mm cannulated screws x3  Indications:84 y.o. female with end-stage dementia was complaining of right hip pain.  She was brought to the emergency department by her family and a valgus impacted right femoral neck fracture was discovered.  With BX was consulted.  After long discussion with the family they opted to undergo percutaneous pinning.  This is mainly due to her ambulatory status and difficulty with following direction.  After discussion of risks, benefits and alternatives they opted to proceed.  The risks discussed included but were not limited to wound healing complications, malunion, nonunion, need for second surgery, risk of damage to surrounding structures.  Again they opted to proceed.  Procedure Detail: Patient was identified in the preoperative holding area the consent was signed by myself.  Had been signed by family members.  The right lower extremity was marked.  She was taken to the operating room and general LMA anesthesia was induced without difficulty.  She is moved on the fracture table.  The lower externally was placed in the traction boot.  The left leg was fastened to a rest in the downward position.  Care was taken to ensure not hyperextend the left hip or to over flex the right leg.  X-ray was used to confirm appropriate views.  The right lower extreme he was prepped and draped in usual sterile fashion.  A shower curtain drape was placed.  Then 3 percutaneous screws were placed at the femoral neck and appropriate position through small stab incisions.  These were 7.3 mm  cannulated screws.  These were placed without difficulty.  Postoperative fluoroscopy confirmed appropriate screw length and position.  The wounds were irrigated with saline.  2-0 Monocryl was used for the skin.  Dermabond was placed.  A soft dressing was placed over this.  The counts were correct at the end of the case.  There were no complications.  Post Op Instructions: Weightbearing as tolerated right lower extremity. No need for DVT prophylaxis from orthopedic standpoint Dressing may be removed in 5 days. Okay to shower with Dermabond in place. She will follow-up with me in 6 weeks for x-rays of the right hip. Call the office with any concerns in the meantime.  Tourniquette Time:nono  Estimated Blood Loss:  Minimal         Drains: none  Blood Given: none         Specimens: none       Complications:  * No complications entered in OR log *         Disposition: PACU - hemodynamically stable.         Condition: stable

## 2018-03-07 NOTE — Plan of Care (Signed)
?  Problem: Elimination: ?Goal: Will not experience complications related to bowel motility ?Outcome: Progressing ?  ?Problem: Pain Managment: ?Goal: General experience of comfort will improve ?Outcome: Progressing ?  ?Problem: Safety: ?Goal: Ability to remain free from injury will improve ?Outcome: Progressing ?  ?

## 2018-03-07 NOTE — Plan of Care (Signed)
  Problem: Nutrition: Goal: Adequate nutrition will be maintained Outcome: Progressing   Problem: Elimination: Goal: Will not experience complications related to bowel motility Outcome: Progressing   Problem: Pain Managment: Goal: General experience of comfort will improve Outcome: Progressing   Problem: Safety: Goal: Ability to remain free from injury will improve Outcome: Progressing   

## 2018-03-07 NOTE — Anesthesia Postprocedure Evaluation (Signed)
Anesthesia Post Note  Patient: Lynn Cameron  Procedure(s) Performed: PERCUTANEOUS PINNING OF RIGHT FEMORAL NECK (Right Hip)     Patient location during evaluation: PACU Anesthesia Type: General Level of consciousness: awake and alert Pain management: pain level controlled Vital Signs Assessment: post-procedure vital signs reviewed and stable Respiratory status: spontaneous breathing, nonlabored ventilation, respiratory function stable and patient connected to nasal cannula oxygen Cardiovascular status: blood pressure returned to baseline and stable Postop Assessment: no apparent nausea or vomiting Anesthetic complications: no    Last Vitals:  Vitals:   03/07/18 1300 03/07/18 1305  BP:  139/61  Pulse: 71 64  Resp: 16 13  Temp:    SpO2: 98% 98%    Last Pain:  Vitals:   03/07/18 0408  TempSrc: Oral                 Audry Pili

## 2018-03-07 NOTE — Progress Notes (Signed)
Initial Nutrition Assessment  DOCUMENTATION CODES:   Non-severe (moderate) malnutrition in context of chronic illness  INTERVENTION:    Magic cup TID with meals, each supplement provides 290 kcal and 9 grams of protein  NUTRITION DIAGNOSIS:   Moderate Malnutrition related to chronic illness(Alzheimer's dementia) as evidenced by mild muscle depletion, moderate muscle depletion, moderate fat depletion, percent weight loss(32% weight loss within 6 months).  GOAL:   Patient will meet greater than or equal to 90% of their needs  MONITOR:   PO intake, Supplement acceptance  REASON FOR ASSESSMENT:   Malnutrition Screening Tool    ASSESSMENT:   84 yo female with PMH of Alzheimer's dementia, thyroid disease, HLD, HTN, and bladder cancer who was admitted on 10/13 with R hip fracture. S/P pinning of R femoral neck 10/14.   Patient just back from surgery. Patient's daughter provided history. Patient has become very contrary since developing dementia earlier this year. She was eating very poorly earlier this year and had lost weight down to 95 lbs from 140 lbs. Since a few months ago, she has been eating better and has gained up to 112 lbs. No current weight available. She had been followed by Hospice for malnutrition, until she began gaining weight. Now she is followed by Palliative Care. Patient has not been receiving any PO supplements that the daughter is aware of. She likes ice cream, so she may accept Magic cup supplement well. Daughter is ordering patient's meals for her.   Labs reviewed. Medications reviewed and include Colace.  Per review of weight encounters and discussion with patient's daughter, she went from 63 kg (08/01/17) to 43.1 kg (12/01/17).  32% weight loss within 6 months is significant. Since then she has gained weight, but no current weight is available.    NUTRITION - FOCUSED PHYSICAL EXAM:    Most Recent Value  Orbital Region  Moderate depletion  Upper Arm  Region  Unable to assess  Thoracic and Lumbar Region  Unable to assess  Buccal Region  Moderate depletion  Temple Region  Moderate depletion  Clavicle Bone Region  Moderate depletion  Clavicle and Acromion Bone Region  Moderate depletion  Scapular Bone Region  Mild depletion  Dorsal Hand  Moderate depletion  Patellar Region  Mild depletion  Anterior Thigh Region  Mild depletion  Posterior Calf Region  Mild depletion  Edema (RD Assessment)  None  Hair  Reviewed  Eyes  Unable to assess  Mouth  Unable to assess  Skin  Reviewed  Nails  Reviewed       Diet Order:   Diet Order            Diet regular Room service appropriate? Yes; Fluid consistency: Thin  Diet effective now              EDUCATION NEEDS:   No education needs have been identified at this time  Skin:  Skin Assessment: Reviewed RN Assessment  Last BM:  none documented  Height:   Ht Readings from Last 1 Encounters:  12/01/17 5\' 4"  (1.626 m)    Weight:   Wt Readings from Last 1 Encounters:  12/01/17 43.1 kg    Estimated Nutritional Needs:   Kcal:  1500-1700  Protein:  70-80 gm  Fluid:  >/= 1.5 L    Molli Barrows, RD, LDN, [place] Pager [phone] After Hours Pager [phone]

## 2018-03-07 NOTE — Plan of Care (Signed)
  Problem: Activity: Goal: Risk for activity intolerance will decrease Outcome: Not Applicable Patient for surgery in AM.   Problem: Coping: Goal: Level of anxiety will decrease Outcome: Progressing   Problem: Pain Managment: Goal: General experience of comfort will improve Outcome: Progressing   Problem: Safety: Goal: Ability to remain free from injury will improve Outcome: Progressing

## 2018-03-07 NOTE — Consult Note (Signed)
Reason for Consult:Right femoral neck fracture Referring Physician: Dr. Julianne Handler is an 84 y.o. female.  HPI: Patient is a demented 84 year old female who lives in an assisted living memory care center.  She complained of right hip pain and therefore was brought to the emergency department where a valgus impacted femoral neck fracture was discovered on x-ray.  She is unable to give any history of her injury.  Per her family she is really only oriented to herself.  She frequently tries to get out of bed and mobilize even in the setting of this fracture.  She is difficult to direct. Based on conversation with the family they would like to proceed with percutaneous pinning of her hip fracture given her ambulatory status.  At baseline she does not have a history of diabetes or heart disease.  Past Medical History:  Diagnosis Date  . Alzheimer's dementia ([place])   . High cholesterol   . Hypertension   . Thyroid disease     History reviewed. No pertinent surgical history.  History reviewed. No pertinent family history.  Social History:  reports that she has never smoked. She has never used smokeless tobacco. She reports that she does not drink alcohol or use drugs.  Allergies: No Known Allergies  Medications: I have reviewed the patient's current medications.  Results for orders placed or performed during the hospital encounter of 03/06/18 (from the past 48 hour(s))  ABO/Rh     Status: None   Collection Time: 03/06/18  4:13 PM  Result Value Ref Range   ABO/RH(D)      B POS Performed at [facility], [place] [address] [place], [place] [address]   Basic metabolic panel     Status: Abnormal   Collection Time: 03/06/18  4:14 PM  Result Value Ref Range   Sodium 143 135 - 145 mmol/L   Potassium 4.1 3.5 - 5.1 mmol/L   Chloride 104 98 - 111 mmol/L   CO2 27 22 - 32 mmol/L   Glucose, Bld 115 (H) 70 - 99 mg/dL   BUN 23 8 - 23 mg/dL   Creatinine, Ser 0.80 0.44 -  1.00 mg/dL   Calcium 9.0 8.9 - 10.3 mg/dL   GFR calc non Af Amer >60 >60 mL/min   GFR calc Af Amer >60 >60 mL/min    Comment: (NOTE) The eGFR has been calculated using the CKD EPI equation. This calculation has not been validated in all clinical situations. eGFR's persistently <60 mL/min signify possible Chronic Kidney Disease.    Anion gap 12 5 - 15    Comment: Performed at [facility], [place] [address], [place], [place] [address]  CBC with Differential     Status: Abnormal   Collection Time: 03/06/18  4:14 PM  Result Value Ref Range   WBC 11.8 (H) 4.0 - 10.5 K/uL   RBC 4.08 3.87 - 5.11 MIL/uL   Hemoglobin 11.8 (L) 12.0 - 15.0 g/dL   HCT 36.8 36.0 - 46.0 %   MCV 90.2 80.0 - 100.0 fL   MCH 28.9 26.0 - 34.0 pg   MCHC 32.1 30.0 - 36.0 g/dL   RDW 13.4 11.5 - 15.5 %   Platelets 314 150 - 400 K/uL   nRBC 0.0 0.0 - 0.2 %   Neutrophils Relative % 80 %   Neutro Abs 9.5 (H) 1.7 - 7.7 K/uL   Lymphocytes Relative 7 %   Lymphs Abs 0.8 0.7 - 4.0 K/uL   Monocytes  Relative 10 %   Monocytes Absolute 1.2 (H) 0.1 - 1.0 K/uL   Eosinophils Relative 2 %   Eosinophils Absolute 0.2 0.0 - 0.5 K/uL   Basophils Relative 0 %   Basophils Absolute 0.0 0.0 - 0.1 K/uL   Immature Granulocytes 1 %   Abs Immature Granulocytes 0.09 (H) 0.00 - 0.07 K/uL    Comment: Performed at [facility], [place] [address] [place], [place] [address]  Protime-INR     Status: None   Collection Time: 03/06/18  4:14 PM  Result Value Ref Range   Prothrombin Time 11.6 11.4 - 15.2 seconds   INR 0.86     Comment: Performed at [facility], [place] [address], [place], [place] [address]  Type and screen [place]     Status: None   Collection Time: 03/06/18  4:14 PM  Result Value Ref Range   ABO/RH(D) B POS    Antibody Screen NEG    Sample Expiration      03/09/2018 Performed at [facility], [place] [address], [place], [place]  [address]   Surgical pcr screen     Status: None   Collection Time: 03/06/18  8:15 PM  Result Value Ref Range   MRSA, PCR NEGATIVE NEGATIVE   Staphylococcus aureus NEGATIVE NEGATIVE    Comment: (NOTE) The Xpert SA Assay (FDA approved for NASAL specimens in patients 49 years of age and older), is one component of a comprehensive surveillance program. It is not intended to diagnose infection nor to guide or monitor treatment. Performed at [facility], [place] [address] [place], [place] [address]   Type and screen [place]     Status: None   Collection Time: 03/07/18  1:15 AM  Result Value Ref Range   ABO/RH(D) B POS    Antibody Screen NEG    Sample Expiration      03/10/2018 Performed at [facility], [place] [address] [place], [place] [address]   ABO/Rh     Status: None   Collection Time: 03/07/18  1:15 AM  Result Value Ref Range   ABO/RH(D)      B POS Performed at [place] [address], [place], [place] [address]     Dg Hip Malvin Johns Or Wo Pelvis 2-3 Views Right  Result Date: 03/06/2018 CLINICAL DATA:  Fall several weeks ago with recent fracture on x-ray EXAM: DG HIP (WITH OR WITHOUT PELVIS) 3V RIGHT COMPARISON:  None. FINDINGS: Pelvic ring is intact. There is a subcapital right femoral neck fracture with impaction and angulation at the fracture site. No soft tissue changes are noted. IMPRESSION: Right femoral neck fracture Electronically Signed   By: Inez Catalina M.D.   On: 03/06/2018 15:44    Review of Systems  Unable to perform ROS: Dementia   Blood pressure 128/72, pulse 79, temperature 99.6 F (37.6 C), temperature source Oral, resp. rate 17, SpO2 90 %. Physical Exam  Constitutional: She appears well-nourished.  HENT:  Head: Normocephalic.  Eyes: Conjunctivae are normal.  Neck: Neck supple.  Cardiovascular: Normal rate.  Respiratory: Effort normal.  GI: Soft.  Musculoskeletal:  Patient complains of pain with palpation or  attempted motion of the right hip.  Unable to assess sensation due to demented status.  She does have active motor function with bilateral lower and upper extremities.  No signs of injuries to bilateral upper extremity's.  Left lower extremity without sign of  injury.  Neurological: She is alert.  Skin: Skin is warm.    Assessment/Plan: Patient has advanced dementia and a right valgus impacted femoral neck fracture.  After long conversation with the family about risks, benefits and alternatives of surgery they wish to proceed.  This is mainly due to her ambulatory status and difficulty with following instructions.  We will plan for percutaneous pinning of her right hip.  Postoperatively she will be weightbearing as tolerated.  Erle Crocker 03/07/2018, 11:00 AM

## 2018-03-08 ENCOUNTER — Encounter (HOSPITAL_COMMUNITY): Payer: Self-pay | Admitting: Orthopaedic Surgery

## 2018-03-08 DIAGNOSIS — E440 Moderate protein-calorie malnutrition: Secondary | ICD-10-CM

## 2018-03-08 LAB — BASIC METABOLIC PANEL
ANION GAP: 8 (ref 5–15)
BUN: 22 mg/dL (ref 8–23)
CALCIUM: 8.6 mg/dL — AB (ref 8.9–10.3)
CO2: 25 mmol/L (ref 22–32)
CREATININE: 0.77 mg/dL (ref 0.44–1.00)
Chloride: 107 mmol/L (ref 98–111)
GFR calc Af Amer: >60 mL/min (ref >60)
GFR calc non Af Amer: >60 mL/min (ref >60)
GLUCOSE: 109 mg/dL — AB (ref 70–99)
Potassium: 3.6 mmol/L (ref 3.5–5.1)
Sodium: 140 mmol/L (ref 135–145)

## 2018-03-08 LAB — CBC WITH DIFFERENTIAL/PLATELET
Abs Immature Granulocytes: 0.03 10*3/uL (ref 0.00–0.07)
BASOS PCT: 0 %
Basophils Absolute: 0 10*3/uL (ref 0.0–0.1)
EOS ABS: 0 10*3/uL (ref 0.0–0.5)
Eosinophils Relative: 0 %
HCT: 29.1 % — ABNORMAL LOW (ref 36.0–46.0)
Hemoglobin: 9.2 g/dL — ABNORMAL LOW (ref 12.0–15.0)
IMMATURE GRANULOCYTES: 0 %
Lymphocytes Relative: 11 %
Lymphs Abs: 1 10*3/uL (ref 0.7–4.0)
MCH: 28.1 pg (ref 26.0–34.0)
MCHC: 31.6 g/dL (ref 30.0–36.0)
MCV: 89 fL (ref 80.0–100.0)
MONOS PCT: 15 %
Monocytes Absolute: 1.3 10*3/uL — ABNORMAL HIGH (ref 0.1–1.0)
NEUTROS PCT: 74 %
NRBC: 0 % (ref 0.0–0.2)
Neutro Abs: 6.5 10*3/uL (ref 1.7–7.7)
PLATELETS: 266 10*3/uL (ref 150–400)
RBC: 3.27 MIL/uL — ABNORMAL LOW (ref 3.87–5.11)
RDW: 13.4 % (ref 11.5–15.5)
WBC: 8.9 10*3/uL (ref 4.0–10.5)

## 2018-03-08 LAB — MAGNESIUM: Magnesium: 1.9 mg/dL (ref 1.7–2.4)

## 2018-03-08 MED FILL — Fentanyl Citrate Preservative Free (PF) Inj 100 MCG/2ML: INTRAMUSCULAR | Qty: 2 | Status: AC

## 2018-03-08 NOTE — Clinical Social Work Note (Signed)
Clinical Social Work Assessment  Patient Details  Name: Lynn Cameron MRN: [id number] Date of Birth: 07-01-1926  Date of referral:  03/08/18               Reason for consult:  Discharge Planning                Permission sought to share information with:  Case Manager, Facility Sport and exercise psychologist, Family Supports Permission granted to share information::  Yes, Verbal Permission Granted  Name::     Audrea Muscat  Agency::  SNFs  Relationship::  daughter  Contact Information:  [phone]  Housing/Transportation Living arrangements for the past 2 months:  [place] of Information:  Patient Patient Interpreter Needed:  None Criminal Activity/Legal Involvement Pertinent to Current Situation/Hospitalization:  No - Comment as needed Significant Relationships:  Adult Children Lives with:  Facility Resident Do you feel safe going back to the place where you live?  No Need for family participation in patient care:  Yes (Comment)  Care giving concerns:  CSW received referral for possible SNF placement at time of discharge. Spoke with patient regarding possibility of SNF placement . Patient's  daughter  is currently unable to care for her at their home given patient's current needs and fall risk.  Patient and  daughter  expressed understanding of PT recommendation and are agreeable to SNF placement at time of discharge. CSW to continue to follow and assist with discharge planning needs.     Social Worker assessment / plan:  Spoke with patient and daughter concerning possibility of rehab at [facility] before returning home.    Employment status:  Retired Nurse, adult PT Recommendations:  [place] / Referral to community resources:  [place]  Patient/Family's Response to care:  Patient and  daughter   recognize need for rehab before returning home and are agreeable to a SNF in [place]. They report  preference for   [facility], [facility] or [facility]  . CSW explained insurance authorization process. Patient's family reported that they want patient to get stronger to be able to come back to [place].   Patient/Family's Understanding of and Emotional Response to Diagnosis, Current Treatment, and Prognosis:  Patient/family is realistic regarding therapy needs and expressed being hopeful for SNF placement. Patient expressed understanding of CSW role and discharge process as well as medical condition. No questions/concerns about plan or treatment.    Emotional Assessment Appearance:  Appears stated age Attitude/Demeanor/Rapport:  Gracious Affect (typically observed):  Pleasant Orientation:  Oriented to Self Alcohol / Substance use:    Psych involvement (Current and /or in the community):  No (Comment)  Discharge Needs  Concerns to be addressed:  Discharge Planning Concerns Readmission within the last 30 days:  No Current discharge risk:  Dependent with Mobility Barriers to Discharge:  Continued Medical Work up   [employer], [place] 03/08/2018, 3:27 PM

## 2018-03-08 NOTE — Plan of Care (Signed)
  Problem: Safety: Goal: Ability to remain free from injury will improve Outcome: Progressing   

## 2018-03-08 NOTE — NC FL2 (Signed)
[place] LEVEL OF CARE SCREENING TOOL     IDENTIFICATION  Patient Name: Lynn Cameron Birthdate: January 12, 1927 Sex: female Admission Date (Current Location): 03/06/2018  [facility] and [place] Number:  Herbalist and Address:  [facility] [place], [place] [address], [place], [place] [address]      Provider Number: [id number]  Attending Physician Name and Address:  Aline August, MD  Relative Name and Phone Number:  Audrea Muscat (daughter) [phone]    Current Level of Care: Hospital Recommended Level of Care: [place] Prior Approval Number:    Date Approved/Denied: 03/08/18 PASRR Number: [id number]  Discharge Plan: SNF    Current Diagnoses: Patient Active Problem List   Diagnosis Date Noted  . Malnutrition of moderate degree 03/08/2018  . Fracture of femoral neck, right ([place]) 03/06/2018  . Dementia ([place]) 03/06/2018  . Hypothyroidism 03/06/2018  . Anxiety 03/06/2018  . Hip fracture ([place]) 03/06/2018  . Protein-calorie malnutrition ([place]) 12/01/2017    Orientation RESPIRATION BLADDER Height & Weight     Self  Normal Incontinent Weight:   Height:     BEHAVIORAL SYMPTOMS/MOOD NEUROLOGICAL BOWEL NUTRITION STATUS      Continent Diet(see discharge summary)  AMBULATORY STATUS COMMUNICATION OF NEEDS Skin   Extensive Assist Verbally Surgical wounds(right hip surgical incision)                       Personal Care Assistance Level of Assistance  Bathing, Feeding, Dressing, Total care Bathing Assistance: Maximum assistance Feeding assistance: Limited assistance Dressing Assistance: Maximum assistance Total Care Assistance: Maximum assistance   Functional Limitations Info  Sight, Hearing, Speech Sight Info: Adequate Hearing Info: Impaired Speech Info: Adequate    SPECIAL CARE FACTORS FREQUENCY  PT (By licensed PT), OT (By licensed OT)     PT Frequency: 5x weekly OT Frequency: 5x weekly             Contractures Contractures Info: Not present    Additional Factors Info  Code Status, Allergies Code Status Info: DNR Allergies Info: No Known           Current Medications (03/08/2018):  This is the current hospital active medication list Current Facility-Administered Medications  Medication Dose Route Frequency Provider Last Rate Last Dose  . 0.9 %  sodium chloride infusion   Intravenous Continuous Aline August, MD 50 mL/hr at 03/08/18 1004    . docusate sodium (COLACE) capsule 100 mg  100 mg Oral BID Erle Crocker, MD      . lactated ringers infusion   Intravenous Continuous Erle Crocker, MD      . levothyroxine (SYNTHROID, LEVOTHROID) tablet 50 mcg  50 mcg Oral QAC breakfast Aline August, MD   50 mcg at 03/07/18 1807  . LORazepam (ATIVAN) tablet 0.5 mg  0.5 mg Oral BID Erle Crocker, MD   0.5 mg at 03/08/18 0949  . metoCLOPramide (REGLAN) tablet 5-10 mg  5-10 mg Oral Q8H PRN Erle Crocker, MD       Or  . metoCLOPramide (REGLAN) injection 5-10 mg  5-10 mg Intravenous Q8H PRN Erle Crocker, MD      . morphine 2 MG/ML injection 2 mg  2 mg Intravenous Q4H PRN Erle Crocker, MD      . ondansetron [facility]) tablet 4 mg  4 mg Oral Q6H PRN Erle Crocker, MD       Or  . ondansetron [facility]) injection 4 mg  4  mg Intravenous Q6H PRN Erle Crocker, MD      . oxyCODONE-acetaminophen (PERCOCET/ROXICET) 5-325 MG per tablet 1 tablet  1 tablet Oral Q4H PRN Erle Crocker, MD   1 tablet at 03/08/18 1306  . sertraline (ZOLOFT) tablet 100 mg  100 mg Oral Daily Aline August, MD   100 mg at 03/08/18 0951  . traZODone (DESYREL) tablet 50 mg  50 mg Oral QHS Erle Crocker, MD   50 mg at 03/06/18 2025     Discharge Medications: Please see discharge summary for a list of discharge medications.  Relevant Imaging Results:  Relevant Lab Results:   Additional Information SSN: [id number]  Alberteen Sam, LCSW

## 2018-03-08 NOTE — Progress Notes (Signed)
Subjective: 1 Day Post-Op Procedure(s) (LRB): PERCUTANEOUS PINNING OF RIGHT FEMORAL NECK (Right)  Patient does not appropriately answer questions.   Objective: Vital signs in last 24 hours: Temp:  [97.7 F (36.5 C)-98.2 F (36.8 C)] 98.2 F (36.8 C) (10/15 0329) Pulse Rate:  [62-89] 65 (10/15 0329) Resp:  [13-16] 14 (10/15 0329) BP: (115-167)/(56-77) 128/62 (10/15 0329) SpO2:  [93 %-100 %] 93 % (10/15 0329)   Recent Labs    03/06/18 1614 03/08/18 0536  HGB 11.8* 9.2*   Recent Labs    03/06/18 1614 03/08/18 0536  WBC 11.8* 8.9  RBC 4.08 3.27*  HCT 36.8 29.1*  PLT 314 266   Recent Labs    03/06/18 1614 03/08/18 0536  NA 143 140  K 4.1 3.6  CL 104 107  CO2 27 25  BUN 23 22  CREATININE 0.80 0.77  GLUCOSE 115* 109*  CALCIUM 9.0 8.6*   Recent Labs    03/06/18 1614  INR 0.86   Awake and alert Dressing intact.  She is moving RLE.   Assessment/Plan: 1 Day Post-Op Procedure(s) (LRB): PERCUTANEOUS PINNING OF RIGHT FEMORAL NECK (Right)  She seems to be doing well.  She is moving around in bed.  Okay for discharge once cleared by medicine and physical therapy. We will defer need for postoperative DVT prophylaxis to medical team given that she is weightbearing as tolerated. Discussed her follow-up with her daughter.  They will send x-rays of the right hip in 6 weeks time unless there are complications before then.    Erle Crocker 03/08/2018, 8:40 AM

## 2018-03-08 NOTE — Care Management Important Message (Signed)
Important Message  Patient Details  Name: Lynn Cameron MRN: [id number] Date of Birth: 1926-09-29   Medicare Important Message Given:  Yes    Salik Grewell Montine Circle 03/08/2018, 3:43 PM

## 2018-03-08 NOTE — Progress Notes (Addendum)
Patient ID: Lynn Cameron, female   DOB: 02-Feb-1927, 84 y.o.   MRN: [id number]  PROGRESS NOTE    Lynn Cameron  OZY:[id number] DOB: 07/26/1926 DOA: 03/06/2018 PCP: Patient, No Pcp Per   Brief Narrative:  Patient 84 year old female with history of advanced dementia currently residing in memory care, frequent falls, hypothyroidism, anxiety, bladder cancer presented with right hip pain.  She was found to have right femoral neck fracture.  Orthopedics was consulted.  She underwent percutaneous pinning of the right femoral neck on 03/07/2018.   Assessment & Plan:   Principal Problem:   Fracture of femoral neck, right (HCC) Active Problems:   Dementia (HCC)   Hypothyroidism   Anxiety   Hip fracture (HCC)   Malnutrition of moderate degree  Right femoral neck fracture -Status postpercutaneous pinning of the right femoral neck on 03/07/2018 by orthopedics.  Activity/wound care as per orthopedics recommendations.  No need for DVT prophylaxis as per orthopedics -PT eval.  Social worker for nursing home placement -Pain management -Fall precautions  Dementia -Pretty advanced.  Monitor.  Patient lives in memory care facility currently -If condition worsens postoperatively, consider hospice/comfort measures  Moderate malnutrition -Follow nutrition recommendations  Hypothyroidism -Continue Synthroid  Anxiety -Continue sertraline.  History of bladder cancer -Status post resection and 6 weeks of radiation therapy.  Currently in remission    DVT prophylaxis: Lovenox Code Status: DNR as verified from prior notes and DNR form in the chart Family Communication: None at bedside Disposition Plan: Probable nursing home in 1 to 2 days  Consultants: Orthopedics  Procedures:  percutaneous pinning of the right femoral neck on 03/07/2018  Antimicrobials: None   Subjective: Patient seen and examined at bedside.  She is awake but confused.  Poor historian.  No overnight fever, vomiting  reported. Objective: Vitals:   03/07/18 1334 03/07/18 1937 03/08/18 0123 03/08/18 0329  BP: 140/68 (!) 115/56 (!) 130/58 128/62  Pulse: 71 84 65 65  Resp: 15 16 14 14   Temp: 97.8 F (36.6 C) 98 F (36.7 C)  98.2 F (36.8 C)  TempSrc: Oral Oral  Oral  SpO2: 97% 96% 93% 93%    Intake/Output Summary (Last 24 hours) at 03/08/2018 0817 Last data filed at 03/08/2018 0800 Gross per 24 hour  Intake 1373.59 ml  Output 50 ml  Net 1323.59 ml   There were no vitals filed for this visit.  Examination:  General exam: Elderly female, lying in bed, awake but confused.  Very thinly built.  No distress Respiratory system: Bilateral decreased breath sounds at bases, no wheezing Cardiovascular system: S1 & S2 heard, Rate controlled Gastrointestinal system: Abdomen is nondistended, soft and nontender. Normal bowel sounds heard. Extremities: No cyanosis, clubbing, edema       Data Reviewed: I have personally reviewed following labs and imaging studies  CBC: Recent Labs  Lab 03/06/18 1614 03/08/18 0536  WBC 11.8* 8.9  NEUTROABS 9.5* 6.5  HGB 11.8* 9.2*  HCT 36.8 29.1*  MCV 90.2 89.0  PLT 314 048   Basic Metabolic Panel: Recent Labs  Lab 03/06/18 1614 03/08/18 0536  NA 143 140  K 4.1 3.6  CL 104 107  CO2 27 25  GLUCOSE 115* 109*  BUN 23 22  CREATININE 0.80 0.77  CALCIUM 9.0 8.6*  MG  --  1.9   GFR: CrCl cannot be calculated (Unknown ideal weight.). Liver Function Tests: No results for input(s): AST, ALT, ALKPHOS, BILITOT, PROT, ALBUMIN in the last 168 hours. No results for input(s): LIPASE, AMYLASE in  the last 168 hours. No results for input(s): AMMONIA in the last 168 hours. Coagulation Profile: Recent Labs  Lab 03/06/18 1614  INR 0.86   Cardiac Enzymes: No results for input(s): CKTOTAL, CKMB, CKMBINDEX, TROPONINI in the last 168 hours. BNP (last 3 results) No results for input(s): PROBNP in the last 8760 hours. HbA1C: No results for input(s): HGBA1C in the  last 72 hours. CBG: Recent Labs  Lab 03/07/18 1311  GLUCAP 109*   Lipid Profile: No results for input(s): CHOL, HDL, LDLCALC, TRIG, CHOLHDL, LDLDIRECT in the last 72 hours. Thyroid Function Tests: No results for input(s): TSH, T4TOTAL, FREET4, T3FREE, THYROIDAB in the last 72 hours. Anemia Panel: No results for input(s): VITAMINB12, FOLATE, FERRITIN, TIBC, IRON, RETICCTPCT in the last 72 hours. Sepsis Labs: No results for input(s): PROCALCITON, LATICACIDVEN in the last 168 hours.  Recent Results (from the past 240 hour(s))  Surgical pcr screen     Status: None   Collection Time: 03/06/18  8:15 PM  Result Value Ref Range Status   MRSA, PCR NEGATIVE NEGATIVE Final   Staphylococcus aureus NEGATIVE NEGATIVE Final    Comment: (NOTE) The Xpert SA Assay (FDA approved for NASAL specimens in patients 71 years of age and older), is one component of a comprehensive surveillance program. It is not intended to diagnose infection nor to guide or monitor treatment. Performed at [facility], [place] [address] [place], [place] [address]          Radiology Studies: Dg C-arm 1-60 Min  Result Date: 03/07/2018 CLINICAL DATA:  Status post percutaneous pinning of the right femoral neck for a fracture. EXAM: OPERATIVE right HIP (WITH PELVIS IF PERFORMED) 2 VIEWS TECHNIQUE: Fluoroscopic spot image(s) were submitted for interpretation post-operatively. COMPARISON:  Preoperative study of March 06, 2018 FINDINGS: Two fluoro spot images are submitted. Reported fluoro time is 1 minutes, 6 seconds. The patient has undergone placement of 3 screws traversing the intertrochanteric region, femoral neck, and inter in the femoral head. No immediate postprocedure complication is observed. IMPRESSION: The patient has undergone percutaneous fixation screw placement for a fracture of the right femoral neck. Electronically Signed   By: David  Martinique M.D.   On: 03/07/2018 13:05   Dg Hip Operative Unilat W Or  W/o Pelvis Right  Result Date: 03/07/2018 CLINICAL DATA:  Status post percutaneous pinning of the right femoral neck for a fracture. EXAM: OPERATIVE right HIP (WITH PELVIS IF PERFORMED) 2 VIEWS TECHNIQUE: Fluoroscopic spot image(s) were submitted for interpretation post-operatively. COMPARISON:  Preoperative study of March 06, 2018 FINDINGS: Two fluoro spot images are submitted. Reported fluoro time is 1 minutes, 6 seconds. The patient has undergone placement of 3 screws traversing the intertrochanteric region, femoral neck, and inter in the femoral head. No immediate postprocedure complication is observed. IMPRESSION: The patient has undergone percutaneous fixation screw placement for a fracture of the right femoral neck. Electronically Signed   By: David  Martinique M.D.   On: 03/07/2018 13:05   Dg Hip Unilat W Or Wo Pelvis 2-3 Views Right  Result Date: 03/06/2018 CLINICAL DATA:  Fall several weeks ago with recent fracture on x-ray EXAM: DG HIP (WITH OR WITHOUT PELVIS) 3V RIGHT COMPARISON:  None. FINDINGS: Pelvic ring is intact. There is a subcapital right femoral neck fracture with impaction and angulation at the fracture site. No soft tissue changes are noted. IMPRESSION: Right femoral neck fracture Electronically Signed   By: Inez Catalina M.D.   On: 03/06/2018 15:44  Scheduled Meds: . docusate sodium  100 mg Oral BID  . enoxaparin (LOVENOX) injection  40 mg Subcutaneous Q24H  . levothyroxine  50 mcg Oral QAC breakfast  . LORazepam  0.5 mg Oral BID  . sertraline  100 mg Oral Daily  . traZODone  50 mg Oral QHS   Continuous Infusions: . sodium chloride 75 mL/hr at 03/07/18 1848  . lactated ringers       LOS: 2 days        Aline August, MD [facility] Pager [phone]  If 7PM-7AM, please contact night-coverage [email or web address] Password [email or web address] 03/08/2018, 8:17 AM

## 2018-03-08 NOTE — Progress Notes (Signed)
PT Cancellation Note  Patient Details Name: Lynn Cameron MRN: [id number] DOB: Jan 03, 1927   Cancelled Treatment:    Reason Eval/Treat Not Completed: Active bedrest order   I still plan on working with Lynn Cameron today -- have contacted Dr. Lucia Gaskins (via secure chat) to see if bedrest can be lifted;   Will follow,  Roney Marion, PT  Acute Rehabilitation Services Pager [phone] Office [phone]    Colletta [place] 03/08/2018, 8:01 AM

## 2018-03-08 NOTE — Plan of Care (Signed)

## 2018-03-08 NOTE — Evaluation (Signed)
Physical Therapy Evaluation Patient Details Name: Lynn Cameron MRN: [id number] DOB: 07-14-1926 Today's Date: 03/08/2018   History of Present Illness  84 y.o. female with end-stage dementia was complaining of right hip pain. Found to have Valgus impacted right Femoral neck fracture; now s/p Perc pinning ORIF, WBAT;  has a past medical history of Alzheimer's dementia ([place]), High cholesterol, History of bladder cancer, Hypertension, and Thyroid disease.  Clinical Impression   Patient is s/p above surgery resulting in functional limitations due to the deficits listed below (see PT Problem List). REsiding in [place] Unit at ALF, walks with Rollator RW typically; Presents with decr functional mobility, decr activity tolerance, fall risk;  Patient will benefit from skilled PT to increase their independence and safety with mobility to allow discharge to the venue listed below.       Follow Up Recommendations SNF    Equipment Recommendations  Rolling walker with 5" wheels;3in1 (PT)    Recommendations for Other Services       Precautions / Restrictions Precautions Precautions: Fall Restrictions Weight Bearing Restrictions: Yes RLE Weight Bearing: Weight bearing as tolerated      Mobility  Bed Mobility Overal bed mobility: Needs Assistance Bed Mobility: Supine to Sit     Supine to sit: Mod assist     General bed mobility comments: Heavy mod assist to move hips to EOB and square off hips at EOB  Transfers Overall transfer level: Needs assistance Equipment used: Rolling walker (2 wheeled) Transfers: Sit to/from [facility] Sit to Stand: Mod assist         General transfer comment: Heavy mod assist to power up; significant posterior lean in standing; heavy mod assist to pivot to recliner  Ambulation/Gait             General Gait Details: unable today, with significant posterior lean in standing  Stairs            Wheelchair Mobility     Modified Rankin (Stroke Patients Only)       Balance Overall balance assessment: Needs assistance Sitting-balance support: Bilateral upper extremity supported Sitting balance-Leahy Scale: Poor Sitting balance - Comments: initially needing mod assist to sit up due to posterior lean. Able to progress to minguard assist Postural control: Posterior lean   Standing balance-Leahy Scale: Zero                               Pertinent Vitals/Pain Pain Assessment: Faces Faces Pain Scale: Hurts even more Pain Location: R hip with movement Pain Descriptors / Indicators: Aching;Grimacing(" that's sore" ) Pain Intervention(s): Monitored during session    Home Living Family/patient expects to be discharged to:: Skilled nursing facility                      Prior Function Level of Independence: Needs assistance   Gait / Transfers Assistance Needed: walked with rollator RW on her memory care unit  ADL's / Homemaking Assistance Needed: staff assist        Hand Dominance        Extremity/Trunk Assessment   Upper Extremity Assessment Upper Extremity Assessment: Difficult to assess due to impaired cognition    Lower Extremity Assessment Lower Extremity Assessment: RLE deficits/detail;Generalized weakness;Difficult to assess due to impaired cognition RLE Deficits / Details: grossly decr AROM and strength, limited by pain       Communication   Communication: HOH  Cognition Arousal/Alertness: Awake/alert  Behavior During Therapy: WFL for tasks assessed/performed Overall Cognitive Status: History of cognitive impairments - at baseline                                        General Comments      Exercises     Assessment/Plan    PT Assessment Patient needs continued PT services  PT Problem List Decreased strength;Decreased range of motion;Decreased activity tolerance;Decreased balance;Decreased mobility;Decreased coordination;Decreased  cognition;Decreased knowledge of use of DME;Decreased safety awareness;Decreased knowledge of precautions;Pain       PT Treatment Interventions DME instruction;Gait training;Functional mobility training;Therapeutic activities;Therapeutic exercise;Balance training;Neuromuscular re-education;Cognitive remediation;Patient/family education    PT Goals (Current goals can be found in the Care Plan section)  Acute Rehab PT Goals Patient Stated Goal: did not state PT Goal Formulation: Patient unable to participate in goal setting Time For Goal Achievement: 03/22/18 Potential to Achieve Goals: Fair    Frequency Min 2X/week   Barriers to discharge        Co-evaluation               AM-PAC PT "6 Clicks" Daily Activity  Outcome Measure Difficulty turning over in bed (including adjusting bedclothes, sheets and blankets)?: Unable Difficulty moving from lying on back to sitting on the side of the bed? : Unable Difficulty sitting down on and standing up from a chair with arms (e.g., wheelchair, bedside commode, etc,.)?: Unable Help needed moving to and from a bed to chair (including a wheelchair)?: A Lot Help needed walking in hospital room?: Total Help needed climbing 3-5 steps with a railing? : Total 6 Click Score: 7    End of Session Equipment Utilized During Treatment: Gait belt Activity Tolerance: Patient tolerated treatment well Patient left: in chair;with call bell/phone within reach;with chair alarm set;with family/visitor present Nurse Communication: Mobility status;Other (comment)(pt reports needing to use the bathroom) PT Visit Diagnosis: Unsteadiness on feet (R26.81);Other abnormalities of gait and mobility (R26.89);History of falling (Z91.81)    Time: 1434-1510 PT Time Calculation (min) (ACUTE ONLY): 36 min   Charges:   PT Evaluation $PT Eval Moderate Complexity: 1 Mod PT Treatments $Therapeutic Activity: 8-22 mins        Roney Marion, PT  Acute Rehabilitation  Services Pager [phone] Office [phone]   Colletta [place] 03/08/2018, 4:58 PM

## 2018-03-09 DIAGNOSIS — E440 Moderate protein-calorie malnutrition: Secondary | ICD-10-CM

## 2018-03-09 DIAGNOSIS — S72001D Fracture of unspecified part of neck of right femur, subsequent encounter for closed fracture with routine healing: Secondary | ICD-10-CM

## 2018-03-09 DIAGNOSIS — E039 Hypothyroidism, unspecified: Secondary | ICD-10-CM

## 2018-03-09 DIAGNOSIS — F419 Anxiety disorder, unspecified: Secondary | ICD-10-CM

## 2018-03-09 DIAGNOSIS — F0390 Unspecified dementia without behavioral disturbance: Secondary | ICD-10-CM

## 2018-03-09 MED ORDER — OXYCODONE HCL 5 MG PO TABS: 5.0000 mg | ORAL_TABLET | Freq: Three times a day (TID) | ORAL | 0 refills | Status: DC | PRN

## 2018-03-09 MED ORDER — ASPIRIN EC 81 MG PO TBEC: 81.0000 mg | DELAYED_RELEASE_TABLET | Freq: Every day | ORAL | Status: AC

## 2018-03-09 MED ORDER — LORAZEPAM 0.5 MG PO TABS: 0.5000 mg | ORAL_TABLET | Freq: Two times a day (BID) | ORAL | 0 refills | Status: AC

## 2018-03-09 MED ORDER — DOCUSATE SODIUM 100 MG PO CAPS: 100.0000 mg | ORAL_CAPSULE | Freq: Two times a day (BID) | ORAL | Status: DC

## 2018-03-09 NOTE — Progress Notes (Signed)
Pt refused lab works

## 2018-03-09 NOTE — Progress Notes (Signed)
Patient will DC TN:[id number] Anticipated DC date: 03/09/18 Family notified: Audrea Muscat Transport by: Corey Harold  Per MD patient ready for DC to [facility]. RN, patient, patient's family, and facility notified of DC. Discharge Summary sent to facility. RN given number for report [phone] Room [phone]. DC packet on chart. Ambulance transport requested for patient.  CSW signing off.  [place], [place]

## 2018-03-09 NOTE — Progress Notes (Signed)
Report was called to Rodena Piety Loss adjuster, chartered) at [facility].  Written discharge instructions will be sent via PTAR.  The patient's family is aware of the discharge.

## 2018-03-09 NOTE — Clinical Social Work Placement (Signed)
   CLINICAL SOCIAL WORK PLACEMENT  NOTE  Date:  03/09/2018  Patient Details  Name: Lynn Cameron MRN: [id number] Date of Birth: 12/04/1926  Clinical Social Work is seeking post-discharge placement for this patient at the [place] level of care (*CSW will initial, date and re-position this form in  chart as items are completed):  Yes   Patient/family provided with [place] Work Department's list of facilities offering this level of care within the geographic area requested by the patient (or if unable, by the patient's family).  Yes   Patient/family informed of their freedom to choose among providers that offer the needed level of care, that participate in Medicare, Medicaid or managed care program needed by the patient, have an available bed and are willing to accept the patient.      Patient/family informed of [facility]'s ownership interest in [facility] and [facility], as well as of the fact that they are under no obligation to receive care at these facilities.  PASRR submitted to EDS on       PASRR number received on 03/08/18     Existing PASRR number confirmed on       FL2 transmitted to all facilities in geographic area requested by pt/family on 03/08/18     FL2 transmitted to all facilities within larger geographic area on       Patient informed that his/her managed care company has contracts with or will negotiate with certain facilities, including the following:        Yes   Patient/family informed of bed offers received.  Patient chooses bed at [facility]     Physician recommends and patient chooses bed at      Patient to be transferred to [facility] on 03/09/18.  Patient to be transferred to facility by PTAR     Patient family notified on 03/09/18 of transfer.  Name of family member notified:  Audrea Muscat (daughter)     PHYSICIAN       Additional Comment:     _______________________________________________ Alberteen Sam, LCSW 03/09/2018, 4:37 PM

## 2018-03-09 NOTE — Discharge Instructions (Signed)

## 2018-03-09 NOTE — Discharge Summary (Signed)
Physician Discharge Summary  Lynn Cameron VCB:[id number] DOB: 06-06-1926  PCP: Patient, No Pcp Per  Admit date: 03/06/2018 Discharge date: 03/09/2018  Recommendations for Outpatient Follow-up:  1. MD at SNF on 03/10/2018 with repeat labs (CBC & BMP). 2. Dr. Melony Overly, Orthopedics in 6 weeks.  SNF to coordinate.  Home Health: Patient being discharged to SNF. Equipment/Devices: Deferred to SNF.  Discharge Condition: Improved and stable. CODE STATUS: DNR Diet recommendation: Heart healthy diet.  Discharge Diagnoses:  Principal Problem:   Fracture of femoral neck, right (HCC) Active Problems:   Dementia (HCC)   Hypothyroidism   Anxiety   Hip fracture (HCC)   Malnutrition of moderate degree   Brief Summary: 84 year old female, resident of memory care unit/[facility] prior to admission, PMH of advanced dementia, frequent falls, hypothyroidism, anxiety and bladder cancer presented with right hip pain and was found to have right femoral neck fracture.  Orthopedics was consulted and she underwent percutaneous pinning of the right femoral neck on 03/07/2018.  Assessment and plan:  Right femoral neck fracture - Status postpercutaneous pinning of the right femoral neck on 03/07/2018 by Orthopedics.    Minimal operative estimated blood loss. - Patient doing well without much pain or need for pain medications. - I discussed with Dr. Lucia Gaskins, Orthopedics who recommends: Cleared for discharge, weightbearing as tolerated, aspirin 81 mg daily x30 days for DVT prophylaxis and outpatient follow-up with them in 6 weeks (he discussed with family yesterday who expressed that she may not follow-up).  Advanced dementia -Seems to be advanced.  Reportedly lived at the memory care unit prior to admission.  Pleasantly confused without agitation.  Moderate malnutrition - Follow nutrition recommendations  Hypothyroidism -Continue Synthroid.  Clinically euthyroid.  Consider TSH if one not  recently done as outpatient.  Anxiety -Continue prior sertraline, scheduled Ativan and trazodone.  Discontinued PRN Xanax to avoid duplication and polypharmacy which may increase risk of confusion and frequent falls.  Adjust medications judiciously at SNF as needed.  History of bladder cancer -Status post resection and 6 weeks of radiation therapy.  Currently in remission  Acute blood loss anemia Suspected due to fracture and surgery.  Presented with hemoglobin of 11.8 which dropped to 9.2.  No overt bleeding noted.  Patient refused lab draws twice today.  Recommend follow-up CBC tomorrow at SNF and then periodically thereafter.   Consultations:  Orthopedics  Procedures:  Percutaneous pinning of right femoral neck fracture on 03/07/2018   Discharge Instructions  Discharge Instructions    Call MD for:  difficulty breathing, headache or visual disturbances   Complete by:  As directed    Call MD for:  extreme fatigue   Complete by:  As directed    Call MD for:  persistant dizziness or light-headedness   Complete by:  As directed    Call MD for:  persistant nausea and vomiting   Complete by:  As directed    Call MD for:  redness, tenderness, or signs of infection (pain, swelling, redness, odor or green/yellow discharge around incision site)   Complete by:  As directed    Call MD for:  severe uncontrolled pain   Complete by:  As directed    Call MD for:  temperature >100.4   Complete by:  As directed    Diet - low sodium heart healthy   Complete by:  As directed    Increase activity slowly   Complete by:  As directed        Medication List  STOP taking these medications   ALPRAZolam 0.5 MG tablet Commonly known as:  XANAX     TAKE these medications   acetaminophen 500 MG tablet Commonly known as:  TYLENOL Take 500 mg by mouth 4 (four) times daily.   aspirin EC 81 MG tablet Take 1 tablet (81 mg total) by mouth daily. For 30 days.   cycloSPORINE 0.05 %  ophthalmic emulsion Commonly known as:  RESTASIS Place 1 drop into both eyes 2 (two) times daily.   docusate sodium 100 MG capsule Commonly known as:  COLACE Take 1 capsule (100 mg total) by mouth 2 (two) times daily.   levothyroxine 50 MCG tablet Commonly known as:  SYNTHROID, LEVOTHROID Take 50 mcg by mouth daily before breakfast.   LORazepam 0.5 MG tablet Commonly known as:  ATIVAN Take 1 tablet (0.5 mg total) by mouth 2 (two) times daily.   Mineral Oil Heavy Oil Place 2 drops into both ears once a week.   oxyCODONE 5 MG immediate release tablet Commonly known as:  Oxy IR/ROXICODONE Take 1 tablet (5 mg total) by mouth every 8 (eight) hours as needed for moderate pain or severe pain.   sertraline 100 MG tablet Commonly known as:  ZOLOFT Take 100 mg by mouth daily.   traZODone 50 MG tablet Commonly known as:  DESYREL Take 50 mg by mouth at bedtime.      Follow-up Information    Erle Crocker, MD. Schedule an appointment as soon as possible for a visit in 6 weeks.   Specialty:  Orthopedic Surgery Contact information: [place] [address] [phone]        MD at SNF. Schedule an appointment as soon as possible for a visit on 03/10/2018.   Why:  To be seen with repeat labs (CBC & BMP).         No Known Allergies    Procedures/Studies: Dg C-arm 1-60 Min  Result Date: 03/07/2018 CLINICAL DATA:  Status post percutaneous pinning of the right femoral neck for a fracture. EXAM: OPERATIVE right HIP (WITH PELVIS IF PERFORMED) 2 VIEWS TECHNIQUE: Fluoroscopic spot image(s) were submitted for interpretation post-operatively. COMPARISON:  Preoperative study of March 06, 2018 FINDINGS: Two fluoro spot images are submitted. Reported fluoro time is 1 minutes, 6 seconds. The patient has undergone placement of 3 screws traversing the intertrochanteric region, femoral neck, and inter in the femoral head. No immediate postprocedure complication is  observed. IMPRESSION: The patient has undergone percutaneous fixation screw placement for a fracture of the right femoral neck. Electronically Signed   By: David  Martinique M.D.   On: 03/07/2018 13:05   Dg Hip Operative Unilat W Or W/o Pelvis Right  Result Date: 03/07/2018 CLINICAL DATA:  Status post percutaneous pinning of the right femoral neck for a fracture. EXAM: OPERATIVE right HIP (WITH PELVIS IF PERFORMED) 2 VIEWS TECHNIQUE: Fluoroscopic spot image(s) were submitted for interpretation post-operatively. COMPARISON:  Preoperative study of March 06, 2018 FINDINGS: Two fluoro spot images are submitted. Reported fluoro time is 1 minutes, 6 seconds. The patient has undergone placement of 3 screws traversing the intertrochanteric region, femoral neck, and inter in the femoral head. No immediate postprocedure complication is observed. IMPRESSION: The patient has undergone percutaneous fixation screw placement for a fracture of the right femoral neck. Electronically Signed   By: David  Martinique M.D.   On: 03/07/2018 13:05   Dg Hip Unilat W Or Wo Pelvis 2-3 Views Right  Result Date: 03/06/2018 CLINICAL DATA:  Fall several  weeks ago with recent fracture on x-ray EXAM: DG HIP (WITH OR WITHOUT PELVIS) 3V RIGHT COMPARISON:  None. FINDINGS: Pelvic ring is intact. There is a subcapital right femoral neck fracture with impaction and angulation at the fracture site. No soft tissue changes are noted. IMPRESSION: Right femoral neck fracture Electronically Signed   By: Inez Catalina M.D.   On: 03/06/2018 15:44      Subjective: Patient is pleasantly confused and thereby a poor historian.  She specifically denies pain.  She refused lab work very early this morning and then again later today.  As per RN, patient has been intermittently refusing medications but no agitation.  Discharge Exam:  Vitals:   03/08/18 1337 03/08/18 2045 03/09/18 0349 03/09/18 1357  BP: (!) 108/52 (!) 144/54 (!) 146/97 (!) 100/53  Pulse:  71 64 (!) 59 96  Resp: 16 20 20 20   Temp: (!) 97.5 F (36.4 C) 98.1 F (36.7 C) 97.9 F (36.6 C) 98.2 F (36.8 C)  TempSrc: Oral Oral Oral Oral  SpO2: (!) 81% 95% 93%     General: Pleasant elderly female, small built and thinly nourished, lying comfortably supine in bed.  Oral mucosa moist. Cardiovascular: S1 & S2 heard, RRR, S1/S2 +. No murmurs, rubs, gallops or clicks. No JVD or pedal edema. Respiratory: Clear to auscultation without wheezing, rhonchi or crackles. No increased work of breathing. Abdominal:  Non distended, non tender & soft. No organomegaly or masses appreciated. Normal bowel sounds heard. CNS: Alert and oriented only to self.  Follows simple instructions at times. No focal deficits. Extremities: no edema, no cyanosis.  Moves all extremities well except mild limitation of movement of right lower extremity related to pain.  Right hip surgical site intact without acute findings.  No bruising or hematoma noted either.    The results of significant diagnostics from this hospitalization (including imaging, microbiology, ancillary and laboratory) are listed below for reference.     Microbiology: Recent Results (from the past 240 hour(s))  Surgical pcr screen     Status: None   Collection Time: 03/06/18  8:15 PM  Result Value Ref Range Status   MRSA, PCR NEGATIVE NEGATIVE Final   Staphylococcus aureus NEGATIVE NEGATIVE Final    Comment: (NOTE) The Xpert SA Assay (FDA approved for NASAL specimens in patients 39 years of age and older), is one component of a comprehensive surveillance program. It is not intended to diagnose infection nor to guide or monitor treatment. Performed at [facility], [place] [address] [place], [place] [address]      Labs: CBC: Recent Labs  Lab 03/06/18 1614 03/08/18 0536  WBC 11.8* 8.9  NEUTROABS 9.5* 6.5  HGB 11.8* 9.2*  HCT 36.8 29.1*  MCV 90.2 89.0  PLT 314 025   Basic Metabolic Panel: Recent Labs  Lab 03/06/18 1614  03/08/18 0536  NA 143 140  K 4.1 3.6  CL 104 107  CO2 27 25  GLUCOSE 115* 109*  BUN 23 22  CREATININE 0.80 0.77  CALCIUM 9.0 8.6*  MG  --  1.9   CBG: Recent Labs  Lab 03/07/18 1311  GLUCAP 109*   I discussed in detail with patient's daughter, updated care and answered questions.  Time coordinating discharge: 40 minutes  SIGNED:  Vernell Leep, MD, FACP, [facility]. [facility] Pager [phone]  If 7PM-7AM, please contact night-coverage [email or web address] Password [email or web address] 03/09/2018, 4:22 PM

## 2018-04-14 ENCOUNTER — Non-Acute Institutional Stay: Payer: Medicare HMO | Admitting: Internal Medicine

## 2018-04-14 VITALS — HR 84 | Resp 12

## 2018-04-14 DIAGNOSIS — Z7409 Other reduced mobility: Secondary | ICD-10-CM

## 2018-04-14 DIAGNOSIS — F0390 Unspecified dementia without behavioral disturbance: Secondary | ICD-10-CM

## 2018-04-14 NOTE — Progress Notes (Signed)
[facility] Telephone: [phone] Fax: [phone]  PATIENT NAME: Lynn Cameron DOB: 24-Apr-1927 MRN: [id number]  PRIMARY CARE PROVIDER:  Jacelyn Pi, NP  RESPONSIBLE PARTY:   FAMILY: (dtr) Lynn Cameron [phone], [phone], (son in law)  Lynn Cameron [phone], (son) Lynn Cameron [facility]) [phone], [phone]  INTERVAL HISTORY:   Lynn Cameron is a 84 y.o. year old female with h/o Alzheimer type dementia, GAD,  gait disorder, frequent falls, and protein-calorie malnutrition. Since last seen by Palliative Care 02/18/28, she suffered a fracture of her L hip (femoral neck) for which she underwent surgical pinning early in October.   RECOMMENDATIONS and PLAN:  1.Decreased mobiltiy r/t recent hip fracture and advanced dementia. Prior to surgery patient was up and ambulating about facility. Currently she is wheelchair bound. She is working with physical therapy but progress is limited by her dementia. She makes no attempt to get up on her own, and seems fearful to try. She is a 1 person transfer assist. She will pivot but not take steps.   2. Severe dementia: Bit of a decline over these last few months.  She is totally dependent for hygiene, dressing, and transfers. She is incontinent of bowel and bladder. Over these last few months her speech has dwindled to just a few words. She is almost non-verbal. She appears totally confused and disoriented. She is able to feed herself a regular diet and utilize eating utensils. Her weight is 108.2 lbs.  3. Advanced Care Directives: DNR on chart.  4. F/U NP visit in 1 month; assess if decline in nutrition and /or functional status. Possible hospice eligibility.   I spent 15 minutes providing this consultation,  from 12:30pm to 12:45pm. More than 50% of the time in this consultation was spent coordinating communication.   CODE STATUS: DNR  PPS: 30%  HOSPICE ELIGIBILITY/DIAGNOSIS:  TBD   Past Medical History:  Diagnosis Date  . Alzheimer's dementia ([place])   . High cholesterol   . History of bladder cancer   . Hypertension   . Thyroid disease     SOCIAL HX:  Social History   Tobacco Use  . Smoking status: Former Research scientist (life sciences)  . Smokeless tobacco: Never Used  . Tobacco comment: smoked for over 50 years   Substance Use Topics  . Alcohol use: No    Frequency: Never    ALLERGIES: No Known Allergies   PERTINENT MEDICATIONS:  Outpatient Encounter Medications as of 04/14/2018  Medication Sig  . acetaminophen (TYLENOL) 500 MG tablet Take 500 mg by mouth 4 (four) times daily.   . cycloSPORINE (RESTASIS) 0.05 % ophthalmic emulsion Place 1 drop into both eyes 2 (two) times daily.  Marland Kitchen levothyroxine (SYNTHROID, LEVOTHROID) 50 MCG tablet Take 50 mcg by mouth daily before breakfast.  . LORazepam (ATIVAN) 0.5 MG tablet Take 1 tablet (0.5 mg total) by mouth 2 (two) times daily.  . mineral oil external liquid 2 drops by Does not apply route. Instill 2 drops into each ear once a week  . sertraline (ZOLOFT) 100 MG tablet Take 100 mg by mouth daily.  . traZODone (DESYREL) 50 MG tablet Take 50 mg by mouth at bedtime.  . [DISCONTINUED] docusate sodium (COLACE) 100 MG capsule Take 1 capsule (100 mg total) by mouth 2 (two) times daily.  . [DISCONTINUED] Mineral Oil Heavy OIL Place 2 drops into both ears once a week.  . [DISCONTINUED] oxyCODONE (ROXICODONE) 5 MG immediate release tablet  Take 1 tablet (5 mg total) by mouth every 8 (eight) hours as needed for moderate pain or severe pain.   No facility-administered encounter medications on file as of 04/14/2018.     PHYSICAL EXAM:  VS: HR 84, RR 12 General: NAD, frail appearing, thin, sitting up in her wheelchair. Non-verbal with me. Will only briefly engage eye contact. Unable to follow simple commands. Dozing to sleep during my assessment.  Cardiovascular: regular rate and rhythm Pulmonary: clear ant fields Abdomen: soft, nontender,  + bowel sounds GU: no suprapubic tenderness Extremities: no edema, no joint deformities Skin: no rashes Neurological: Weakness but otherwise nonfocal  Julianne Handler, NP

## 2018-04-15 ENCOUNTER — Encounter: Payer: Self-pay | Admitting: Internal Medicine

## 2018-04-15 ENCOUNTER — Telehealth: Payer: Self-pay | Admitting: Internal Medicine

## 2018-04-15 NOTE — Telephone Encounter (Signed)
04/15/2018 8pm: F/U TC to daughter Audrea Muscat Hassett [phone]. Daughter notes patient conversant and chatty with family members; doesn't note a big change in verbalizations as opposed to before surgery. Audrea Muscat is content for patient to remain wheelchair bound, as she felt her mom was at high fall risk when ambulating about with the walker. Audrea Muscat doubted her mom would survive another fall with possible hip fracture. I supported the family decision to not push further rehab towards ambulation. Patient seems content and is only infrequently making attempts to stand up from chair.  Violeta Gelinas NP-C

## 2018-05-16 ENCOUNTER — Encounter: Payer: Medicare HMO | Admitting: Internal Medicine

## 2018-05-20 ENCOUNTER — Encounter: Payer: Self-pay | Admitting: Internal Medicine

## 2018-05-20 ENCOUNTER — Non-Acute Institutional Stay: Payer: Medicare HMO | Admitting: Internal Medicine

## 2018-05-20 DIAGNOSIS — Z515 Encounter for palliative care: Secondary | ICD-10-CM

## 2018-05-20 NOTE — Progress Notes (Signed)
[facility] Telephone: [phone] Fax: [phone]  PATIENT NAME: Cristian Grieves DOB: [birth year] MRN: [id number]  PRIMARY CARE PROVIDER:  Jacelyn Pi, NP  RESPONSIBLE PARTY:   FAMILY: (dtr) Atlee Abide [facility]) [phone], (son in law)  Elie Goody [phone], (son) Dilcia Rybarczyk [facility]) [phone], [phone]  INTERVAL HISTORY:   Genieve Ramaswamy a 84 y.o.femalewith h/o Alzheimer type dementia, GAD,  gait disorder, frequent falls, and protein-calorie malnutrition. Two months earlier she suffered a fracture of her L hip (femoral neck) for which she underwent surgical pinning. This is a routine f/u palliative care visit.  RECOMMENDATIONS and PLAN:  1.Decreased mobiltiy r/t recent hip fracture and advanced dementia. Patient's FAST score is 7a. Her PPS is 30%. Staff report she is pleasantly conversant but of only short phrases. She is constantly confused but recognizes family members. She is dozing in her wheelchair more frequently. Patient continues wheel chair bound. She is a one person transfer and staff report she has difficulty understanding cuing to sit or stand. She doesn't attempt to stand on her own. She can pivot but not take steps. She is mostly continent on a q 2 hour toileting schedule. She is dependent on and corporative with dressing and hygiene. She is able to feed herself a regular diet and utilize eating utensils. Staff report oral intake of 100%. Her weight is 105.4lbs, down 2.8 lbs over this last month, and down 7.4 lbs (6.5% of total body weight) over the last 2 months. Patient has a choking episode (thought 2/2 difficulty swallowing a pill) with rescue heimlich maneuver. Medication adjustments over this last month includes decrease of Trazadone to 25 (from 50) mg qhs, and decrease of Ativan to 0.25 mg qam and 0.5mg  qhs (from 0.5mg  bid). Her prn Xanax was discontinued. Continues on Zoloft 100mg  qd. 5'2"  2. Advanced  Care Directives: DNR on chart.  3. F/U NP visit in 1 month; assess if decline in nutrition and /or functional status. Possible hospice eligibility.   I spent 60 minutes providing this consultation,  from 9:30am to 10:30am. More than 50% of the time in this consultation was spent coordinating communication, including TC to PCG daughter Stanton Kidney with updates.  CODE STATUS: DNR  HOSPICE ELIGIBILITY/DIAGNOSIS: TBD  PAST MEDICAL HISTORY:  Past Medical History:  Diagnosis Date  . Alzheimer's dementia ([place])   . High cholesterol   . History of bladder cancer   . Hypertension   . Thyroid disease     SOCIAL HX:  Social History   Tobacco Use  . Smoking status: Former Research scientist (life sciences)  . Smokeless tobacco: Never Used  . Tobacco comment: smoked for over 50 years   Substance Use Topics  . Alcohol use: No    Frequency: Never    ALLERGIES: No Known Allergies   PERTINENT MEDICATIONS:  Outpatient Encounter Medications as of 05/20/2018  Medication Sig  . acetaminophen (TYLENOL) 500 MG tablet Take 500 mg by mouth 4 (four) times daily.   Marland Kitchen levothyroxine (SYNTHROID, LEVOTHROID) 50 MCG tablet Take 25 mcg by mouth every other day. Take 1/2 tab (33mcg) QOD  . LORazepam (ATIVAN) 0.5 MG tablet Take 1 tablet (0.5 mg total) by mouth 2 (two) times daily. (Patient taking differently: Take 0.5 mg by mouth 2 (two) times daily. Take 1/2 tab (0.25mg ) qam and 1 tab (0.5mg ) qhs)  . mineral oil external liquid 2 drops by Does not apply route. Instill 2 drops into each ear once a week  .  sertraline (ZOLOFT) 100 MG tablet Take 100 mg by mouth daily.  . traZODone (DESYREL) 50 MG tablet Take 25 mg by mouth at bedtime. 1/2 tab (25mg ) qhs  . cycloSPORINE (RESTASIS) 0.05 % ophthalmic emulsion Place 1 drop into both eyes 2 (two) times daily.   No facility-administered encounter medications on file as of 05/20/2018.     PHYSICAL EXAM:   General: NAD, frail appearing, thin, sitting up in wheelchair in the activity  room Cardiovascular: regular rate and rhythm Pulmonary: clear ant fields Abdomen: soft, nontender, + bowel sounds GU: no suprapubic tenderness Extremities: no edema, no joint deformities Skin: no rashes Neurological: Weakness but otherwise nonfocal  Julianne Handler, NP

## 2018-06-09 NOTE — Progress Notes (Signed)
Jan 17th, 2020 [facility] Palliative Care Telephone: [phone] Fax: [phone]  PATIENT NAME: Lynn Cameron DOB: 09-17-1926 MRN: [id number]  PRIMARY CARE PROVIDER:Nguyen, Loura Halt, NP  RESPONSIBLE PARTY:FAMILY: (dtr) Atlee Abide [facility]) [phone], (son in law)  Elie Goody [phone], (son) Regan Mcbryar [facility]) [phone], [phone]  INTERVAL HISTORY: Lynn Cameron a 85 y.o.femalewithh/oAlzheimer typedementia,GAD,gait disorder, frequent falls, andprotein-calorie malnutrition.Two months earlier she suffered a fracture of her L hip (femoral neck) for which she underwent surgical pinning. This is a f/u palliative care visit form 12/272019. Staff and family asking to assess patient for possible hospice eligibility.   RECOMMENDATIONS and PLAN: 1. Functional and cognitive decline: Patient's fast is 7a, with PPS of 30%.  Sheis constantly though pleasantly confused. She has had increased daytime somnolence such that her anxiolytics have been dose adjusted downwards. She speaks in short phrases only. She has been wheelchair confined since L hip fracture some 3 months earlier. She makes no attempts to stand due to her overwhelming fear of falling. She also is resistant to transfers d/t same fears, requiring a 2 person assist. She requires assistance with hygiene and dressing. She now coughs when drinking fluids, suspicious for aspiration. She needed a rescue Helmick maneuver about 3 weeks ago for a chocking episode when swallowing her pills. Since then, she has had a constant cough resistant to antitussives. She was treated with antibiotics early this month for possible aspiration pneumonia. A recent CXR revealed changes of infrahilar atelectasis or pneumonia. PCP recommended another antibiotic course but patient's daughter doesn't wish to treat any more infections, instead wanting to focus on comfort level of care. Staff report decline  in oral intake to 25-75% of small meals. Her current weight is 106 lbs, which is down 6 lbs over the last 4 months.   2.Advanced Care Directives:DNR on chart. Family wishes focus on comfort care.   3. F/U with hospice physician to ascertain if patient meets hospice eligibility criteria.   I spent 60 minutes providing this consultation,  from 2:20pm to 3:30pm. More than 50% of the time in this consultation was spent coordinating communication, including TC to PCG daughter Stanton Kidney with updates.  CODE STATUS:DNR  HOSPICE ELIGIBILITY/DIAGNOSIS:yes, 2/2 functional and cognitive decline.   PAST MEDICAL HISTORY:  Past Medical History:  Diagnosis Date  . Alzheimer's dementia ([place])   . High cholesterol   . History of bladder cancer   . Hypertension   . Thyroid disease     SOCIAL HX:  Social History   Tobacco Use  . Smoking status: Former Research scientist (life sciences)  . Smokeless tobacco: Never Used  . Tobacco comment: smoked for over 50 years   Substance Use Topics  . Alcohol use: No    Frequency: Never    ALLERGIES: No Known Allergies   PERTINENT MEDICATIONS:  Outpatient Encounter Medications as of 06/10/2018  Medication Sig  . acetaminophen (TYLENOL) 500 MG tablet Take 500 mg by mouth 4 (four) times daily.   . cycloSPORINE (RESTASIS) 0.05 % ophthalmic emulsion Place 1 drop into both eyes 2 (two) times daily.  Marland Kitchen levothyroxine (SYNTHROID, LEVOTHROID) 50 MCG tablet Take 25 mcg by mouth every other day. Take 1/2 tab (11mcg) QOD  . LORazepam (ATIVAN) 0.5 MG tablet Take 1 tablet (0.5 mg total) by mouth 2 (two) times daily. (Patient taking differently: Take 0.5 mg by mouth 2 (two) times daily. Take 1/2 tab (0.25mg ) qam and 1 tab (0.5mg ) qhs)  . mineral oil external liquid  2 drops by Does not apply route. Instill 2 drops into each ear once a week  . sertraline (ZOLOFT) 100 MG tablet Take 100 mg by mouth daily.  . traZODone (DESYREL) 50 MG tablet Take 25 mg by mouth at bedtime. 1/2 tab (25mg ) qhs   No  facility-administered encounter medications on file as of 06/10/2018.     PHYSICAL EXAM:   NAD, frail appearing, thin, sleeping curled up on bed on her left side. She awakened to my touch. Cardiovascular: regular rate and rhythm Pulmonary: clear ant fields Abdomen: soft, nontender, + bowel sounds GU: no suprapubic tenderness Extremities: no edema, no joint deformities Skin: no rashes Neurological: Weakness but otherwise nonfocal  Julianne Handler, NP

## 2018-06-10 ENCOUNTER — Encounter: Payer: Self-pay | Admitting: Internal Medicine

## 2018-06-10 ENCOUNTER — Non-Acute Institutional Stay: Payer: Medicare HMO | Admitting: Internal Medicine

## 2018-06-10 VITALS — BP 142/80 | HR 88 | Resp 20 | Ht 62.0 in | Wt 106.0 lb

## 2018-06-10 DIAGNOSIS — Z515 Encounter for palliative care: Secondary | ICD-10-CM

## 2018-08-24 DEATH — deceased

## 2020-08-29 IMAGING — CR DG HIP (WITH OR WITHOUT PELVIS) 2-3V*R*
3 series · 3 of 3 positions shown · non-contrast
Comparison: None.

CLINICAL DATA: Fall several weeks ago with recent fracture on x-ray

EXAM:
DG HIP (WITH OR WITHOUT PELVIS) 3V RIGHT

[w hip lat right]
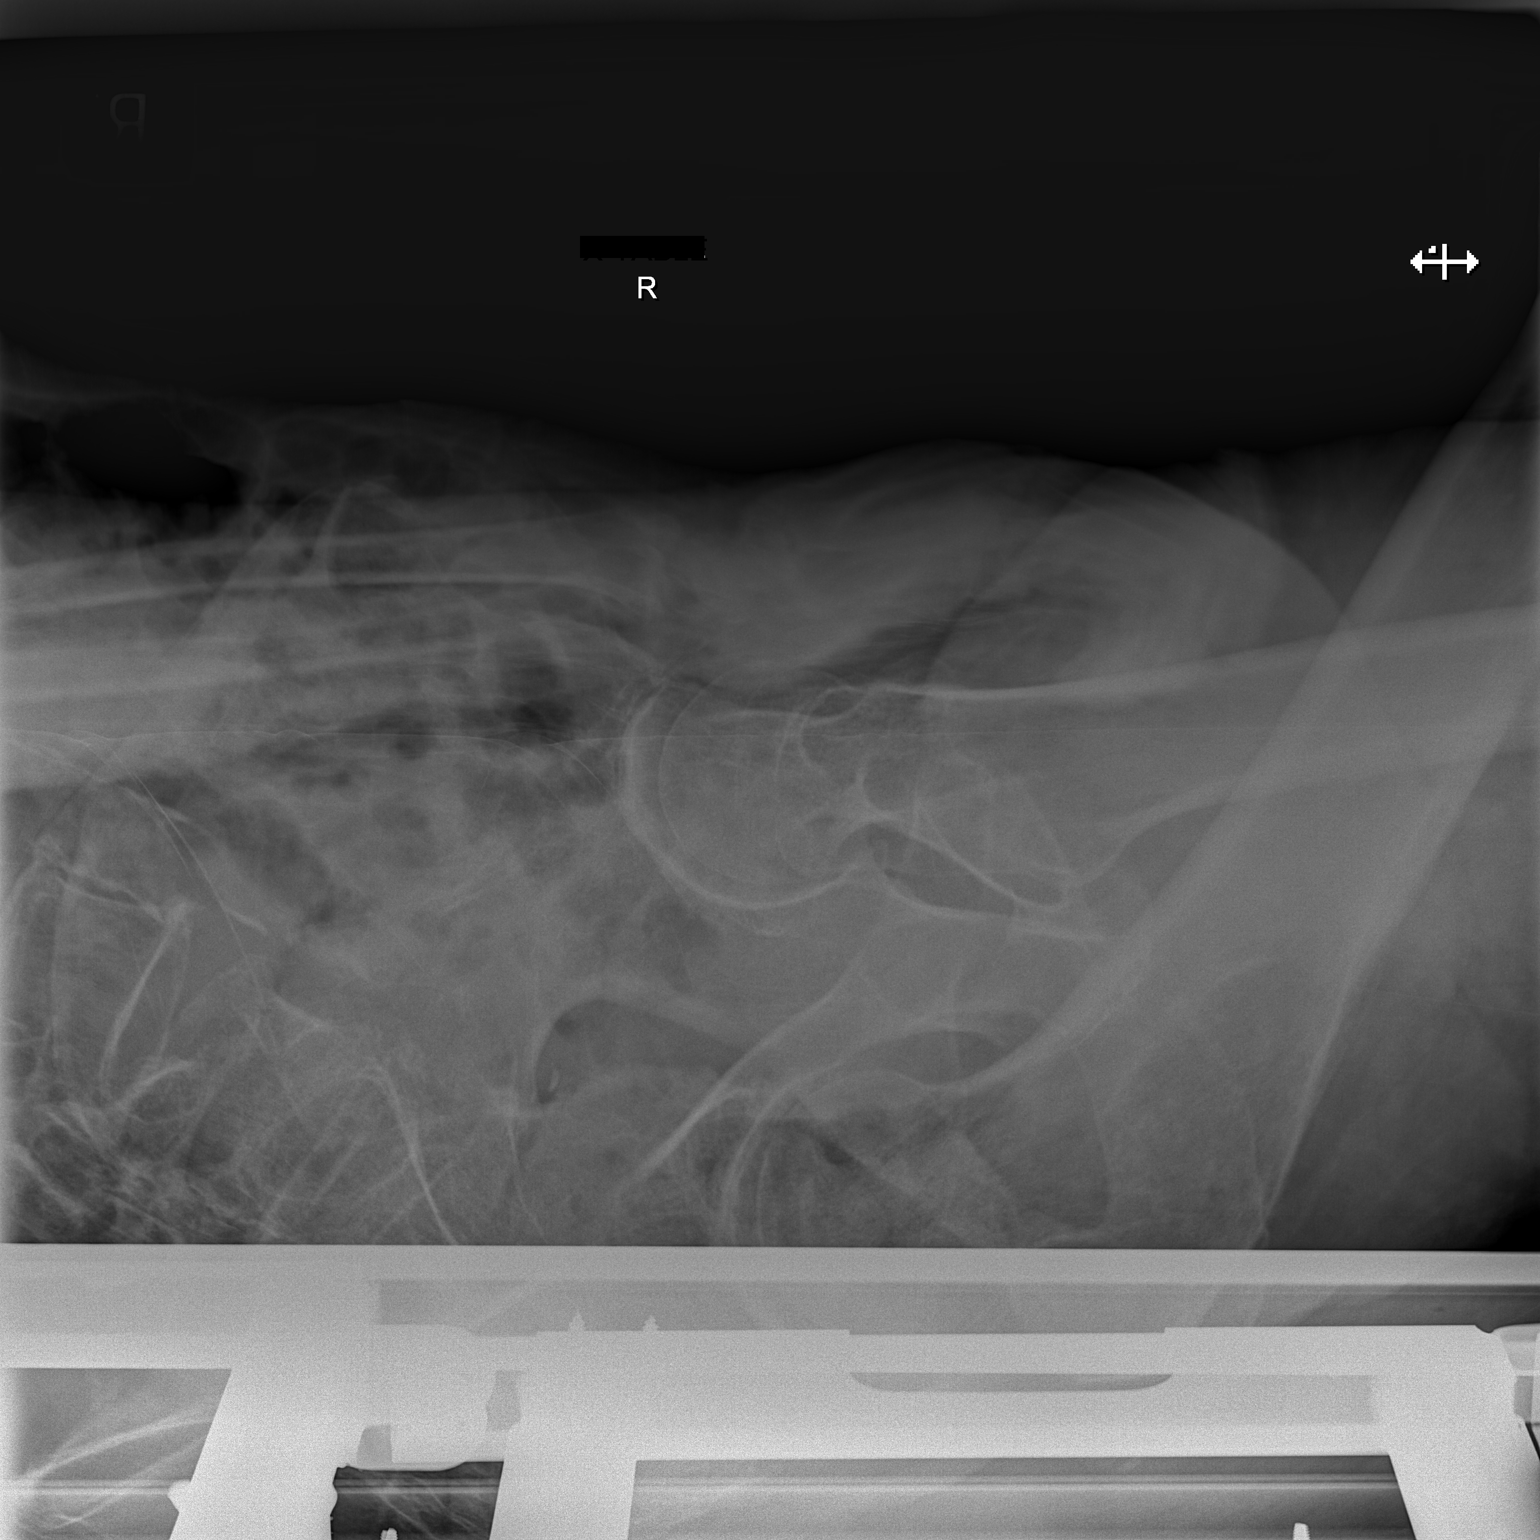

[x pelvis (1 of 2)]
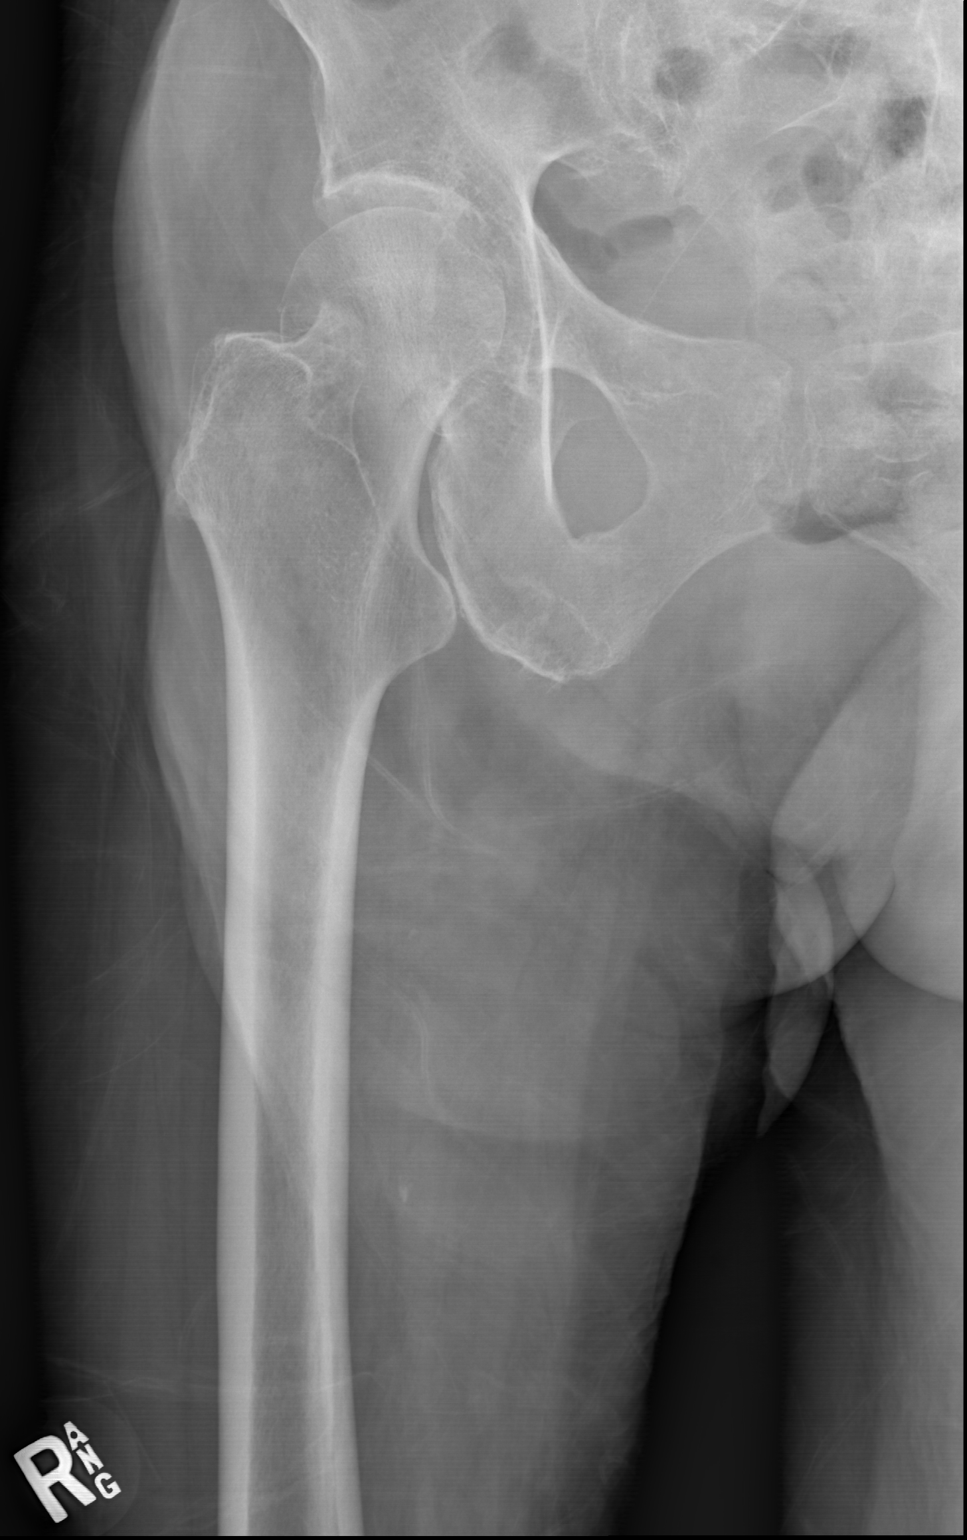

[x pelvis (2 of 2)]
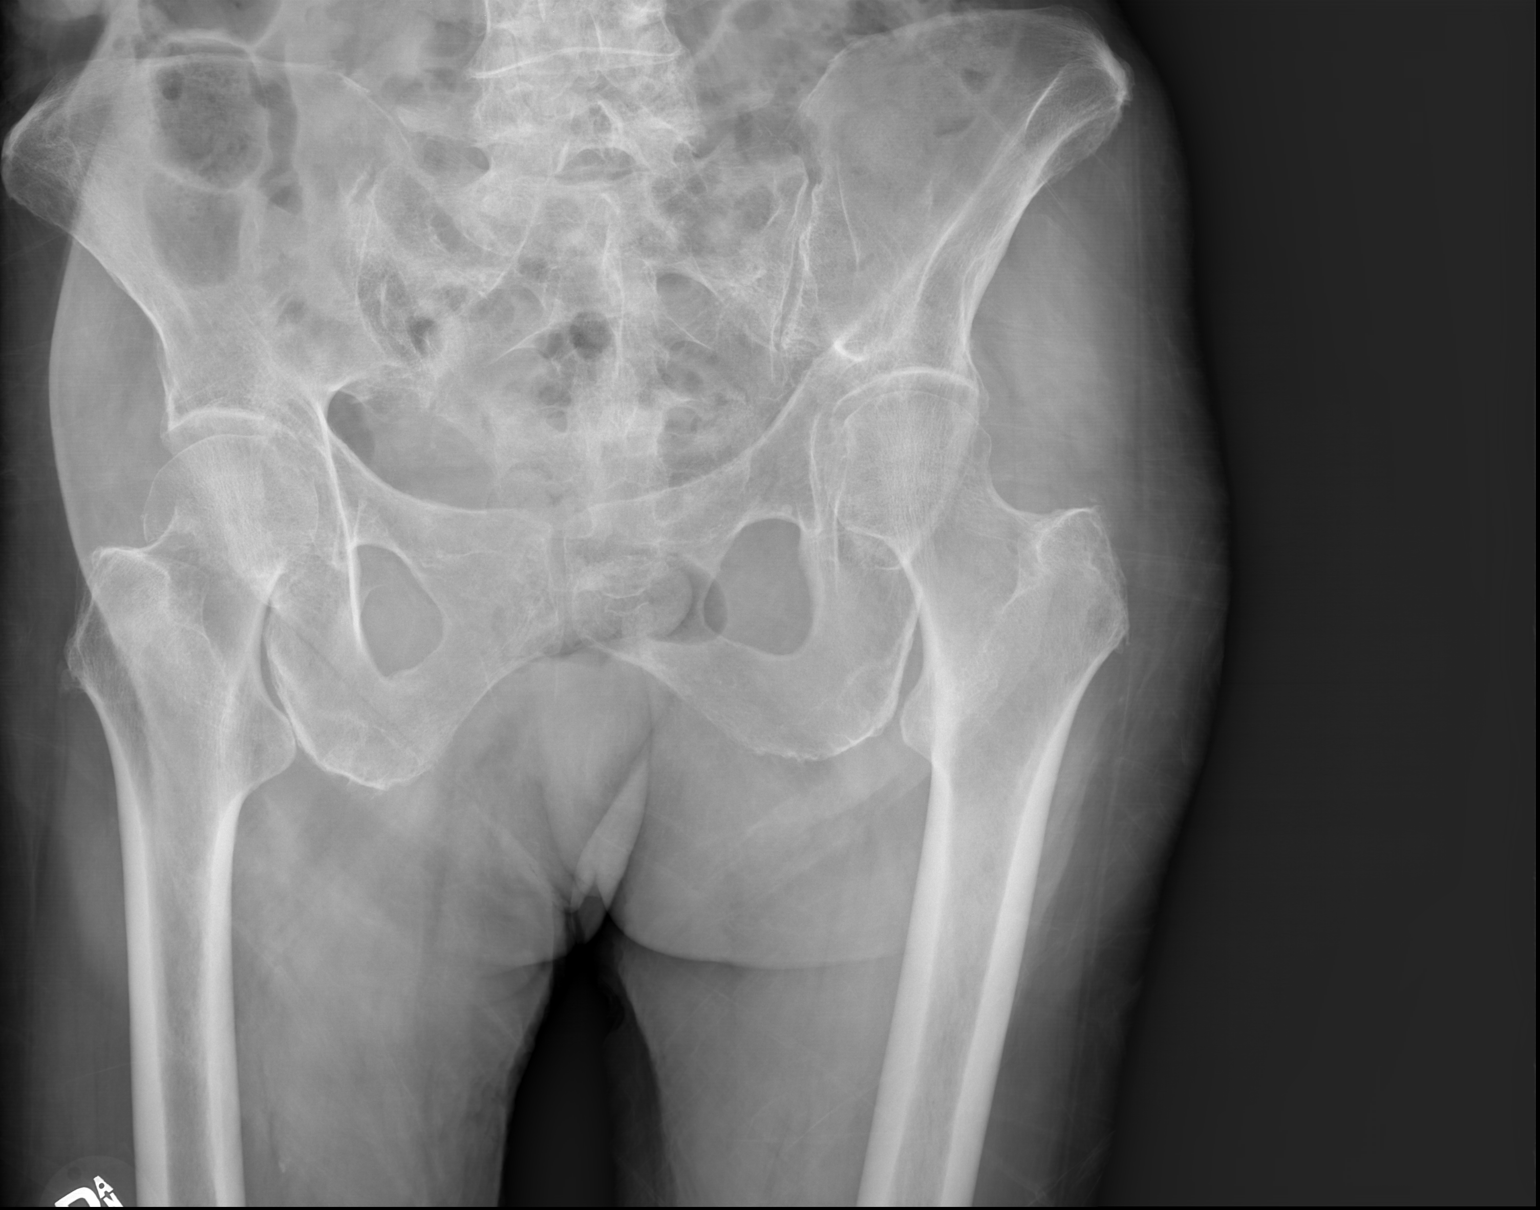

[3 of 3 positions shown; findings below may reference images not displayed]

FINDINGS: Pelvic ring is intact. There is a subcapital right femoral neck
fracture with impaction and angulation at the fracture site. No soft
tissue changes are noted.
IMPRESSION: Right femoral neck fracture
